# Patient Record
Sex: Female | Born: 1990
Health system: Southern US, Community
[De-identification: ages and names within clinical notes are randomized; demographics above are authoritative.]

## PROBLEM LIST (undated history)

## (undated) ENCOUNTER — Inpatient Hospital Stay (HOSPITAL_COMMUNITY): Payer: Self-pay

## (undated) DIAGNOSIS — F419 Anxiety disorder, unspecified: Secondary | ICD-10-CM

## (undated) DIAGNOSIS — R87629 Unspecified abnormal cytological findings in specimens from vagina: Secondary | ICD-10-CM

## (undated) DIAGNOSIS — Z8619 Personal history of other infectious and parasitic diseases: Secondary | ICD-10-CM

## (undated) DIAGNOSIS — B3731 Acute candidiasis of vulva and vagina: Secondary | ICD-10-CM

## (undated) DIAGNOSIS — Z789 Other specified health status: Secondary | ICD-10-CM

## (undated) DIAGNOSIS — T7840XA Allergy, unspecified, initial encounter: Secondary | ICD-10-CM

## (undated) DIAGNOSIS — B373 Candidiasis of vulva and vagina: Secondary | ICD-10-CM

## (undated) HISTORY — PX: WISDOM TOOTH EXTRACTION: SHX21

## (undated) HISTORY — DX: Personal history of other infectious and parasitic diseases: Z86.19

## (undated) HISTORY — DX: Allergy, unspecified, initial encounter: T78.40XA

## (undated) HISTORY — DX: Anxiety disorder, unspecified: F41.9

## (undated) HISTORY — DX: Unspecified abnormal cytological findings in specimens from vagina: R87.629

---

## 2012-06-07 LAB — OB RESULTS CONSOLE RPR: RPR: NONREACTIVE

## 2012-06-07 LAB — OB RESULTS CONSOLE ABO/RH: RH Type: POSITIVE

## 2012-06-07 LAB — OB RESULTS CONSOLE HIV ANTIBODY (ROUTINE TESTING): HIV: NONREACTIVE

## 2012-06-07 LAB — OB RESULTS CONSOLE GC/CHLAMYDIA: Chlamydia: NEGATIVE

## 2012-10-26 NOTE — L&D Delivery Note (Signed)
Delivery Note  SVD viable female Apgars 8,9 over intact perineum but right sulcus tear.  Placenta delivered spontaneously intact with 3VC. Repair with 2-0 Chromic with good support and hemostasis noted and R/V exam confirms.  PH art was sent.  [place] cord blood was not done.  Mother and baby were doing well.  EBL 300cc  Candice Camp, MD

## 2012-11-04 LAB — OB RESULTS CONSOLE GBS: GBS: NEGATIVE

## 2012-11-22 ENCOUNTER — Encounter (HOSPITAL_COMMUNITY): Payer: Self-pay

## 2012-11-22 ENCOUNTER — Inpatient Hospital Stay (HOSPITAL_COMMUNITY)
Admission: AD | Admit: 2012-11-22 | Discharge: 2012-11-22 | Disposition: A | Discharge status: Home / Self Care | Payer: 59 | Source: Ambulatory Visit | Attending: Obstetrics and Gynecology | Admitting: Obstetrics and Gynecology

## 2012-11-22 DIAGNOSIS — O2650 Maternal hypotension syndrome, unspecified trimester: Secondary | ICD-10-CM | POA: Insufficient documentation

## 2012-11-22 DIAGNOSIS — R55 Syncope and collapse: Secondary | ICD-10-CM

## 2012-11-22 DIAGNOSIS — O139 Gestational [pregnancy-induced] hypertension without significant proteinuria, unspecified trimester: Secondary | ICD-10-CM | POA: Insufficient documentation

## 2012-11-22 HISTORY — DX: Other specified health status: Z78.9

## 2012-11-22 LAB — URIC ACID: Uric Acid, Serum: 5.1 mg/dL (ref 2.4–7.0)

## 2012-11-22 LAB — URINALYSIS, ROUTINE W REFLEX MICROSCOPIC
Bilirubin Urine: NEGATIVE
Glucose, UA: NEGATIVE mg/dL
Hgb urine dipstick: NEGATIVE
Specific Gravity, Urine: 1.02 (ref 1.005–1.030)
Urobilinogen, UA: 0.2 mg/dL (ref 0.0–1.0)

## 2012-11-22 LAB — CBC
MCH: 31.5 pg (ref 26.0–34.0)
MCHC: 33.8 g/dL (ref 30.0–36.0)
MCV: 93.1 fL (ref 78.0–100.0)
Platelets: 227 10*3/uL (ref 150–400)
RDW: 12.9 % (ref 11.5–15.5)

## 2012-11-22 LAB — COMPREHENSIVE METABOLIC PANEL
Alkaline Phosphatase: 141 U/L — ABNORMAL HIGH (ref 39–117)
BUN: 10 mg/dL (ref 6–23)
Chloride: 102 mEq/L (ref 96–112)
GFR calc Af Amer: >90 mL/min (ref >90)
Glucose, Bld: 90 mg/dL (ref 70–99)
Sodium: 135 mEq/L (ref 135–145)
Total Bilirubin: 0.1 mg/dL — ABNORMAL LOW (ref 0.3–1.2)

## 2012-11-22 NOTE — MAU Note (Signed)
Pt states around 1400 had near syncopal episode at work, did not hit floor, felt weak and "funny", denies lof or bleeding.

## 2012-11-22 NOTE — MAU Note (Signed)
Pt states b/p was elevated immediately following near syncopal episode, b/p rechecked and was slightly lower.

## 2012-11-22 NOTE — MAU Provider Note (Signed)
Chief Complaint:  Hypertension  First Provider Initiated Contact with Patient 11/22/12 1629     HPI: Monica Garrett is a 22 y.o. G1P0 at [redacted]w[redacted]d who presents to maternity admissions reporting near-syncopal episode at 1430 at work. States she was sitting in a chair, "felt funny' and co-worker saw her start to slide down in the chair. They took her to a room to lie down and Sx resolved. CBG 100's at work immediately after. Has been mildly ill-feeling x a few days. Vomited twice in past 24 hour, few loose stools. Tolerating POs.  No fever, chills, URI Sx, HA, epigastric pain, SOB, chest pain, palpitations. Has seen spots a few times when she stands up, but not at rest. Seen at office after near-syncopal episode. BP 130's/80's per pt. Sent to MAU for PIH and near-syncope eval. Denies contractions, leakage of fluid or vaginal bleeding. Good fetal movement.   Past Medical History: Past Medical History  Diagnosis Date  . No pertinent past medical history   Neg for cardiac   Past obstetric history: OB History    Grav Para Term Preterm Abortions TAB SAB Ect Mult Living   1              # Outc Date GA Lbr Len/2nd Wgt Sex Del Anes PTL Lv   1 CUR               Past Surgical History: Past Surgical History  Procedure Date  . Wisdom tooth extraction     Family History: Family History  Problem Relation Age of Onset  . Other Neg Hx     Social History: History  Substance Use Topics  . Smoking status: Never Smoker   . Smokeless tobacco: Never Used  . Alcohol Use: No    Allergies: No Known Allergies  Meds:  No prescriptions prior to admission    ROS: Pertinent findings in history of present illness.  Physical Exam  Blood pressure 124/80, pulse 89, temperature 99 F (37.2 C), temperature source Oral, resp. rate 16, height 5\' 8"  (1.727 m), weight 197 lb (89.359 kg), SpO2 99.00%. BPs 120-130's/70-80's GENERAL: Well-developed, well-nourished female in no acute distress. Normal  color. HEENT: normocephalic HEART: normal rate RESP: normal effort ABDOMEN: Soft, non-tender, gravid appropriate for gestational age EXTREMITIES: Nontender, no edema NEURO: alert and oriented. DTRs 1+, 1 beat clonus LLE.  SPECULUM EXAM: deferred  FHT:  Baseline 140 , moderate variability, accelerations present, no decelerations Contractions: irreg, mild   Labs: Recent Results (from the past 168 hour(s))  CBC   Collection Time   11/22/12  3:59 PM      Component Value Range   WBC 9.2  4.0 - 10.5 K/uL   RBC 3.78 (*) 3.87 - 5.11 MIL/uL   Hemoglobin 11.9 (*) 12.0 - 15.0 g/dL   HCT 16.1 (*) 09.6 - 04.5 %   MCV 93.1  78.0 - 100.0 fL   MCH 31.5  26.0 - 34.0 pg   MCHC 33.8  30.0 - 36.0 g/dL   RDW 40.9  81.1 - 91.4 %   Platelets 227  150 - 400 K/uL  COMPREHENSIVE METABOLIC PANEL   Collection Time   11/22/12  3:59 PM      Component Value Range   Sodium 135  135 - 145 mEq/L   Potassium 3.8  3.5 - 5.1 mEq/L   Chloride 102  96 - 112 mEq/L   CO2 21  19 - 32 mEq/L   Glucose, Bld 90  70 -  99 mg/dL   BUN 10  6 - 23 mg/dL   Creatinine, Ser 1.61  0.50 - 1.10 mg/dL   Calcium 9.6  8.4 - 09.6 mg/dL   Total Protein 6.6  6.0 - 8.3 g/dL   Albumin 2.8 (*) 3.5 - 5.2 g/dL   AST 18  0 - 37 U/L   ALT 13  0 - 35 U/L   Alkaline Phosphatase 141 (*) 39 - 117 U/L   Total Bilirubin 0.1 (*) 0.3 - 1.2 mg/dL   GFR calc non Af Amer >90  >90 mL/min   GFR calc Af Amer >90  >90 mL/min  URIC ACID   Collection Time   11/22/12  3:59 PM      Component Value Range   Uric Acid, Serum 5.1  2.4 - 7.0 mg/dL  LACTATE DEHYDROGENASE   Collection Time   11/22/12  3:59 PM      Component Value Range   LDH 193  94 - 250 U/L  GLUCOSE, CAPILLARY   Collection Time   11/22/12  4:57 PM      Component Value Range   Glucose-Capillary 111 (*) 70 - 99 mg/dL  URINALYSIS, ROUTINE W REFLEX MICROSCOPIC   Collection Time   11/22/12  5:15 PM      Component Value Range   Color, Urine YELLOW  YELLOW   APPearance CLOUDY (*) CLEAR    Specific Gravity, Urine 1.020  1.005 - 1.030   pH 7.0  5.0 - 8.0   Glucose, UA NEGATIVE  NEGATIVE mg/dL   Hgb urine dipstick NEGATIVE  NEGATIVE   Bilirubin Urine NEGATIVE  NEGATIVE   Ketones, ur NEGATIVE  NEGATIVE mg/dL   Protein, ur NEGATIVE  NEGATIVE mg/dL   Urobilinogen, UA 0.2  0.0 - 1.0 mg/dL   Nitrite NEGATIVE  NEGATIVE   Leukocytes, UA NEGATIVE  NEGATIVE     Imaging:  NA  MAU Course: Tolerating juice and crackers. Feeling better. Ambulating w/out dizziness.  Assessment: 1. Near syncope possibly R/T dehydration due to poor intake/GI losses, resolved.  2. Transient hypertension of pregnancy, antepartum    Plan: Discharge home Labor precautions and fetal kick counts. Increased fluids, small frequent meals. Antiemetics PRN.  PIH precautions.      Follow-up Information    Follow up with Meriel Pica, MD. On 11/23/2012.   Contact information:   [address] [place] [address] [phone]       Follow up with [facility]. (As needed if symptoms worsen)    Contact information:   [address] [id number] mc [place] [address] [phone]          Medication List     As of 11/25/2012 12:46 PM    TAKE these medications         prenatal multivitamin Tabs   Take 1 tablet by mouth at bedtime.        Beecher, CNM 11/22/2012 4:22 PM

## 2012-12-01 ENCOUNTER — Encounter (HOSPITAL_COMMUNITY): Payer: Self-pay | Admitting: *Deleted

## 2012-12-01 ENCOUNTER — Telehealth (HOSPITAL_COMMUNITY): Payer: Self-pay | Admitting: *Deleted

## 2012-12-01 NOTE — Telephone Encounter (Signed)
Preadmission screen  

## 2012-12-07 ENCOUNTER — Encounter (HOSPITAL_COMMUNITY): Payer: Self-pay | Admitting: *Deleted

## 2012-12-07 ENCOUNTER — Inpatient Hospital Stay (HOSPITAL_COMMUNITY)
Admission: AD | Admit: 2012-12-07 | Discharge: 2012-12-10 | DRG: 775 | Disposition: A | Discharge status: Home / Self Care | Payer: 59 | Source: Ambulatory Visit | Attending: Obstetrics and Gynecology | Admitting: Obstetrics and Gynecology

## 2012-12-07 ENCOUNTER — Encounter (HOSPITAL_COMMUNITY): Payer: Self-pay | Admitting: Anesthesiology

## 2012-12-07 ENCOUNTER — Inpatient Hospital Stay (HOSPITAL_COMMUNITY): Payer: 59 | Admitting: Anesthesiology

## 2012-12-07 DIAGNOSIS — O4100X0 Oligohydramnios, unspecified trimester, not applicable or unspecified: Principal | ICD-10-CM | POA: Diagnosis present

## 2012-12-07 LAB — COMPREHENSIVE METABOLIC PANEL
ALT: 16 U/L (ref 0–35)
AST: 17 U/L (ref 0–37)
Albumin: 2.9 g/dL — ABNORMAL LOW (ref 3.5–5.2)
Alkaline Phosphatase: 159 U/L — ABNORMAL HIGH (ref 39–117)
Calcium: 9 mg/dL (ref 8.4–10.5)
GFR calc Af Amer: >90 mL/min (ref >90)
Glucose, Bld: 100 mg/dL — ABNORMAL HIGH (ref 70–99)
Potassium: 4.2 mEq/L (ref 3.5–5.1)
Sodium: 137 mEq/L (ref 135–145)
Total Protein: 6.8 g/dL (ref 6.0–8.3)

## 2012-12-07 LAB — CBC
MCHC: 33.4 g/dL (ref 30.0–36.0)
Platelets: 243 10*3/uL (ref 150–400)
RDW: 13 % (ref 11.5–15.5)
WBC: 11.5 10*3/uL — ABNORMAL HIGH (ref 4.0–10.5)

## 2012-12-07 MED ORDER — OXYTOCIN 40 UNITS IN LACTATED RINGERS INFUSION - SIMPLE MED
62.5000 mL/h | INTRAVENOUS | Status: DC
  Administered 2012-12-08: 62.5 mL/h via INTRAVENOUS

## 2012-12-07 MED ORDER — LACTATED RINGERS IV SOLN: 500.0000 mL | INTRAVENOUS | Status: DC | PRN

## 2012-12-07 MED ORDER — FLEET ENEMA 7-19 GM/118ML RE ENEM: 1.0000 | ENEMA | RECTAL | Status: DC | PRN

## 2012-12-07 MED ORDER — DIPHENHYDRAMINE HCL 50 MG/ML IJ SOLN: 12.5000 mg | INTRAMUSCULAR | Status: DC | PRN

## 2012-12-07 MED ORDER — LACTATED RINGERS IV SOLN
500.0000 mL | Freq: Once | INTRAVENOUS | Status: AC
  Administered 2012-12-07: 500 mL via INTRAVENOUS

## 2012-12-07 MED ORDER — LIDOCAINE HCL (PF) 1 % IJ SOLN
30.0000 mL | INTRAMUSCULAR | Status: DC | PRN
  Filled 2012-12-07: qty 30

## 2012-12-07 MED ORDER — EPHEDRINE 5 MG/ML INJ
10.0000 mg | INTRAVENOUS | Status: DC | PRN
  Filled 2012-12-07: qty 4

## 2012-12-07 MED ORDER — OXYTOCIN BOLUS FROM INFUSION
500.0000 mL | INTRAVENOUS | Status: DC
  Administered 2012-12-08: 500 mL via INTRAVENOUS

## 2012-12-07 MED ORDER — PHENYLEPHRINE 40 MCG/ML (10ML) SYRINGE FOR IV PUSH (FOR BLOOD PRESSURE SUPPORT)
80.0000 ug | PREFILLED_SYRINGE | INTRAVENOUS | Status: DC | PRN
  Filled 2012-12-07: qty 5

## 2012-12-07 MED ORDER — OXYCODONE-ACETAMINOPHEN 5-325 MG PO TABS: 1.0000 | ORAL_TABLET | ORAL | Status: DC | PRN

## 2012-12-07 MED ORDER — FENTANYL 2.5 MCG/ML BUPIVACAINE 1/10 % EPIDURAL INFUSION (WH - ANES)
14.0000 mL/h | INTRAMUSCULAR | Status: DC
  Administered 2012-12-07 – 2012-12-08 (×3): 14 mL/h via EPIDURAL
  Filled 2012-12-07 (×3): qty 125

## 2012-12-07 MED ORDER — EPHEDRINE 5 MG/ML INJ: 10.0000 mg | INTRAVENOUS | Status: DC | PRN

## 2012-12-07 MED ORDER — SODIUM BICARBONATE 8.4 % IV SOLN
INTRAVENOUS | Status: DC | PRN
  Administered 2012-12-07: 5 mL via EPIDURAL

## 2012-12-07 MED ORDER — ONDANSETRON HCL 4 MG/2ML IJ SOLN
4.0000 mg | Freq: Four times a day (QID) | INTRAMUSCULAR | Status: DC | PRN
  Administered 2012-12-07: 4 mg via INTRAVENOUS
  Filled 2012-12-07: qty 2

## 2012-12-07 MED ORDER — PRENATAL MULTIVITAMIN CH: 1.0000 | ORAL_TABLET | Freq: Every day | ORAL | Status: DC

## 2012-12-07 MED ORDER — ACETAMINOPHEN 325 MG PO TABS: 650.0000 mg | ORAL_TABLET | ORAL | Status: DC | PRN

## 2012-12-07 MED ORDER — OXYTOCIN 40 UNITS IN LACTATED RINGERS INFUSION - SIMPLE MED
1.0000 m[IU]/min | INTRAVENOUS | Status: DC
  Administered 2012-12-07: 2 m[IU]/min via INTRAVENOUS
  Filled 2012-12-07: qty 1000

## 2012-12-07 MED ORDER — TERBUTALINE SULFATE 1 MG/ML IJ SOLN: 0.2500 mg | Freq: Once | INTRAMUSCULAR | Status: AC | PRN

## 2012-12-07 MED ORDER — PHENYLEPHRINE 40 MCG/ML (10ML) SYRINGE FOR IV PUSH (FOR BLOOD PRESSURE SUPPORT): 80.0000 ug | PREFILLED_SYRINGE | INTRAVENOUS | Status: DC | PRN

## 2012-12-07 MED ORDER — LACTATED RINGERS IV SOLN
INTRAVENOUS | Status: DC
  Administered 2012-12-07 (×2): via INTRAVENOUS

## 2012-12-07 MED ORDER — CITRIC ACID-SODIUM CITRATE 334-500 MG/5ML PO SOLN: 30.0000 mL | ORAL | Status: DC | PRN

## 2012-12-07 MED ORDER — IBUPROFEN 600 MG PO TABS: 600.0000 mg | ORAL_TABLET | Freq: Four times a day (QID) | ORAL | Status: DC | PRN

## 2012-12-07 NOTE — Progress Notes (Signed)
Dr Rana Snare called for update on pt, informed of ptstatus, sve, fhr, uc pattern, decreasing pitocin, no new orders reiceved

## 2012-12-07 NOTE — Anesthesia Preprocedure Evaluation (Signed)
Anesthesia Evaluation  Patient identified by MRN, date of birth, ID band Patient awake    Reviewed: Allergy & Precautions, H&P , Patient's Chart, lab work & pertinent test results  History of Anesthesia Complications (+) PONV  Airway Mallampati: II TM Distance: >3 FB Neck ROM: full    Dental  (+) Teeth Intact   Pulmonary  breath sounds clear to auscultation        Cardiovascular Rhythm:regular Rate:Normal     Neuro/Psych    GI/Hepatic   Endo/Other    Renal/GU      Musculoskeletal   Abdominal   Peds  Hematology   Anesthesia Other Findings       Reproductive/Obstetrics (+) Pregnancy                           Anesthesia Physical Anesthesia Plan  ASA: II  Anesthesia Plan: Epidural   Post-op Pain Management:    Induction:   Airway Management Planned:   Additional Equipment:   Intra-op Plan:   Post-operative Plan:   Informed Consent: I have reviewed the patients History and Physical, chart, labs and discussed the procedure including the risks, benefits and alternatives for the proposed anesthesia with the patient or authorized representative who has indicated his/her understanding and acceptance.   Dental Advisory Given  Plan Discussed with:   Anesthesia Plan Comments: (Labs checked- platelets confirmed with RN in room. Fetal heart tracing, per RN, reported to be stable enough for sitting procedure. Discussed epidural, and patient consents to the procedure:  included risk of possible headache,backache, failed block, allergic reaction, and nerve injury. This patient was asked if she had any questions or concerns before the procedure started. )        Anesthesia Quick Evaluation

## 2012-12-07 NOTE — H&P (Signed)
Monica Garrett is a 22 y.o. female presenting for Induction of labor due to oligohydraminos. Pt seen in the office this morning by Dr Vincente Poli after [place].  EFW was 6+15 lb which is 27%, her AFI was less than 3rd% and suspicious for meconium.  Pt has had labile BPs in the last several visits but today was normal.  Pts cervix is favorable and pt was sent to L&D for induction.  GBS-. History OB History   Grav Para Term Preterm Abortions TAB SAB Ect Mult Living   1              Past Medical History  Diagnosis Date  . No pertinent past medical history    Past Surgical History  Procedure Laterality Date  . Wisdom tooth extraction     Family History: family history includes Cancer in her maternal grandmother and paternal grandfather and Hypertension in her father and mother.  There is no history of Other. Social History:  reports that she has never smoked. She has never used smokeless tobacco. She reports that she does not drink alcohol or use illicit drugs.   Prenatal Transfer Tool  Maternal Diabetes: No Genetic Screening: Normal Maternal Ultrasounds/Referrals: Normal Fetal Ultrasounds or other Referrals:  None recent with oligohydraminos Maternal Substance Abuse:  No Significant Maternal Medications:  None Significant Maternal Lab Results:  None Other Comments:  None  ROS  Dilation: 2 Effacement (%): 40 Station: -2 Exam by:: Isac Sarna, RN Blood pressure 128/84, pulse 84, temperature 98.7 F (37.1 C), temperature source Oral, resp. rate 16, height 5\' 8"  (1.727 m), weight 89.359 kg (197 lb). Exam Physical Exam  Prenatal labs: ABO, Rh: --/Positive/-- (08/13 0000) Antibody: Negative (08/13 0000) Rubella: Immune (08/13 0000) RPR: Nonreactive (08/13 0000)  HBsAg: Negative (08/13 0000)  HIV: Non-reactive (08/13 0000)  GBS: Negative (01/10 0000)   Assessment/Plan: IUP at term with oligohydraminos and favorable cervix.  Plan Pitocin and AROM  Anticipate SVD   Addasyn Mcbreen  C 12/07/2012, 11:49 AM

## 2012-12-07 NOTE — Anesthesia Procedure Notes (Signed)

## 2012-12-08 ENCOUNTER — Encounter (HOSPITAL_COMMUNITY): Payer: Self-pay | Admitting: *Deleted

## 2012-12-08 LAB — RPR: RPR Ser Ql: NONREACTIVE

## 2012-12-08 MED ORDER — SENNOSIDES-DOCUSATE SODIUM 8.6-50 MG PO TABS
2.0000 | ORAL_TABLET | Freq: Every day | ORAL | Status: DC
  Administered 2012-12-08 – 2012-12-09 (×2): 2 via ORAL

## 2012-12-08 MED ORDER — DIPHENHYDRAMINE HCL 25 MG PO CAPS: 25.0000 mg | ORAL_CAPSULE | Freq: Four times a day (QID) | ORAL | Status: DC | PRN

## 2012-12-08 MED ORDER — ONDANSETRON HCL 4 MG/2ML IJ SOLN: 4.0000 mg | INTRAMUSCULAR | Status: DC | PRN

## 2012-12-08 MED ORDER — ONDANSETRON HCL 4 MG PO TABS: 4.0000 mg | ORAL_TABLET | ORAL | Status: DC | PRN

## 2012-12-08 MED ORDER — PRENATAL MULTIVITAMIN CH
1.0000 | ORAL_TABLET | Freq: Every day | ORAL | Status: DC
  Administered 2012-12-08 – 2012-12-10 (×3): 1 via ORAL
  Filled 2012-12-08 (×3): qty 1

## 2012-12-08 MED ORDER — LANOLIN HYDROUS EX OINT: TOPICAL_OINTMENT | CUTANEOUS | Status: DC | PRN

## 2012-12-08 MED ORDER — BENZOCAINE-MENTHOL 20-0.5 % EX AERO
1.0000 "application " | INHALATION_SPRAY | CUTANEOUS | Status: DC | PRN
  Administered 2012-12-10: 1 via TOPICAL
  Filled 2012-12-08 (×2): qty 56

## 2012-12-08 MED ORDER — WITCH HAZEL-GLYCERIN EX PADS
1.0000 "application " | MEDICATED_PAD | CUTANEOUS | Status: DC | PRN
  Administered 2012-12-10: 1 via TOPICAL

## 2012-12-08 MED ORDER — DIBUCAINE 1 % RE OINT
1.0000 "application " | TOPICAL_OINTMENT | RECTAL | Status: DC | PRN
  Filled 2012-12-08: qty 28

## 2012-12-08 MED ORDER — ZOLPIDEM TARTRATE 5 MG PO TABS: 5.0000 mg | ORAL_TABLET | Freq: Every evening | ORAL | Status: DC | PRN

## 2012-12-08 MED ORDER — SIMETHICONE 80 MG PO CHEW: 80.0000 mg | CHEWABLE_TABLET | ORAL | Status: DC | PRN

## 2012-12-08 MED ORDER — MEASLES, MUMPS & RUBELLA VAC ~~LOC~~ INJ
0.5000 mL | INJECTION | Freq: Once | SUBCUTANEOUS | Status: DC
  Filled 2012-12-08: qty 0.5

## 2012-12-08 MED ORDER — OXYCODONE-ACETAMINOPHEN 5-325 MG PO TABS
1.0000 | ORAL_TABLET | ORAL | Status: DC | PRN
  Administered 2012-12-08 (×3): 1 via ORAL
  Administered 2012-12-09 (×4): 2 via ORAL
  Administered 2012-12-10: 1 via ORAL
  Filled 2012-12-08: qty 1
  Filled 2012-12-08 (×2): qty 2
  Filled 2012-12-08 (×2): qty 1
  Filled 2012-12-08: qty 2
  Filled 2012-12-08: qty 1

## 2012-12-08 MED ORDER — MEDROXYPROGESTERONE ACETATE 150 MG/ML IM SUSP: 150.0000 mg | INTRAMUSCULAR | Status: DC | PRN

## 2012-12-08 MED ORDER — IBUPROFEN 600 MG PO TABS
600.0000 mg | ORAL_TABLET | Freq: Four times a day (QID) | ORAL | Status: DC
  Administered 2012-12-08 – 2012-12-10 (×10): 600 mg via ORAL
  Filled 2012-12-08 (×10): qty 1

## 2012-12-08 MED ORDER — TETANUS-DIPHTH-ACELL PERTUSSIS 5-2.5-18.5 LF-MCG/0.5 IM SUSP
0.5000 mL | Freq: Once | INTRAMUSCULAR | Status: AC
  Administered 2012-12-09: 0.5 mL via INTRAMUSCULAR
  Filled 2012-12-08: qty 0.5

## 2012-12-08 NOTE — Anesthesia Postprocedure Evaluation (Signed)
  Anesthesia Post-op Note  Patient: Monica Garrett  Procedure(s) Performed: * No procedures listed *  Patient Location: PACU and Mother/Baby  Anesthesia Type:Epidural  Level of Consciousness: awake, alert  and oriented  Airway and Oxygen Therapy: Patient Spontanous Breathing  Post-op Pain: mild  Post-op Assessment: Post-op Vital signs reviewed and Patient's Cardiovascular Status Stable  Post-op Vital Signs: Reviewed and stable  Complications: No apparent anesthesia complications

## 2012-12-08 NOTE — Progress Notes (Signed)
Post Partum Day 1 Subjective: no complaints and up ad lib  Objective: Blood pressure 114/71, pulse 75, temperature 98.6 F (37 C), temperature source Oral, resp. rate 20, height 5\' 8"  (1.727 m), weight 197 lb (89.359 kg), SpO2 97.00%, unknown if currently breastfeeding.  Physical Exam:  General: alert, cooperative and appears stated age Lochia: appropriate Uterine Fundus: firm Incision: healing well DVT Evaluation: No evidence of DVT seen on physical exam.   Recent Labs  12/07/12 1035  HGB 13.2  HCT 39.5    Assessment/Plan: Circumcision prior to discharge Routine care   LOS: 1 day   Monica Garrett L 12/08/2012, 8:04 AM

## 2012-12-09 LAB — CBC
HCT: 30 % — ABNORMAL LOW (ref 36.0–46.0)
Hemoglobin: 10 g/dL — ABNORMAL LOW (ref 12.0–15.0)
MCH: 31.3 pg (ref 26.0–34.0)
MCV: 93.8 fL (ref 78.0–100.0)
RBC: 3.2 MIL/uL — ABNORMAL LOW (ref 3.87–5.11)

## 2012-12-09 MED ORDER — BENZOCAINE-MENTHOL 20-0.5 % EX AERO: 1.0000 "application " | INHALATION_SPRAY | CUTANEOUS | Status: DC | PRN

## 2012-12-09 MED ORDER — IBUPROFEN 600 MG PO TABS: 600.0000 mg | ORAL_TABLET | Freq: Four times a day (QID) | ORAL | Status: DC | PRN

## 2012-12-09 MED ORDER — OXYCODONE-ACETAMINOPHEN 5-325 MG PO TABS: 1.0000 | ORAL_TABLET | Freq: Four times a day (QID) | ORAL | Status: DC | PRN

## 2012-12-09 NOTE — Progress Notes (Signed)
Post Partum Day 2 Subjective: no complaints  Objective: Blood pressure 97/61, pulse 72, temperature 98.1 F (36.7 C), temperature source Oral, resp. rate 20, height 5\' 8"  (1.727 m), weight 197 lb (89.359 kg), SpO2 94.00%, unknown if currently breastfeeding.  Physical Exam:  General: alert, cooperative and no distress Lochia: appropriate Uterine Fundus: firm Incision: healing well DVT Evaluation: No evidence of DVT seen on physical exam.   Recent Labs  12/07/12 1035 12/09/12 0616  HGB 13.2 10.0*  HCT 39.5 30.0*    Assessment/Plan: Discharge home   LOS: 2 days   Ladell Lea II,Ellice Boultinghouse E 12/09/2012, 9:03 AM

## 2012-12-09 NOTE — Discharge Summary (Signed)
Obstetric Discharge Summary Reason for Admission: induction of labor Prenatal Procedures: ultrasound Intrapartum Procedures: spontaneous vaginal delivery Postpartum Procedures: none Complications-Operative and Postpartum: none Hemoglobin  Date Value Range Status  12/09/2012 10.0* 12.0 - 15.0 g/dL Final     DELTA CHECK NOTED     REPEATED TO VERIFY     HCT  Date Value Range Status  12/09/2012 30.0* 36.0 - 46.0 % Final    Physical Exam:  General: alert, cooperative and no distress Lochia: appropriate Uterine Fundus: firm Incision: healing well DVT Evaluation: No evidence of DVT seen on physical exam.  Discharge Diagnoses: Term Pregnancy-delivered  Discharge Information: Date: 12/09/2012 Activity: pelvic rest Diet: routine Medications: PNV, Ibuprofen and Percocet Condition: stable Instructions: refer to practice specific booklet Discharge to: home   Newborn Data: Live born female  Birth Weight: 7 lb 6 oz (3345 g) APGAR: 8, 9  Home with mother.  Monicka Cyran II,Myrikal Messmer E 12/09/2012, 9:07 AM

## 2012-12-10 MED ORDER — OXYCODONE-ACETAMINOPHEN 5-325 MG PO TABS: 2.0000 | ORAL_TABLET | Freq: Four times a day (QID) | ORAL | Status: DC | PRN

## 2012-12-10 NOTE — Progress Notes (Signed)
PPD #2 Decided to not go home yesterday Is ready today No C/O  VSS Afeb FFNT  D/C home

## 2013-05-24 ENCOUNTER — Telehealth: Payer: Self-pay

## 2013-05-24 ENCOUNTER — Ambulatory Visit (INDEPENDENT_AMBULATORY_CARE_PROVIDER_SITE_OTHER): Payer: BC Managed Care – PPO | Admitting: Family Medicine

## 2013-05-24 VITALS — BP 122/68 | HR 76 | Temp 98.0°F | Resp 18 | Ht 69.0 in | Wt 174.0 lb

## 2013-05-24 DIAGNOSIS — L089 Local infection of the skin and subcutaneous tissue, unspecified: Secondary | ICD-10-CM

## 2013-05-24 DIAGNOSIS — L309 Dermatitis, unspecified: Secondary | ICD-10-CM

## 2013-05-24 DIAGNOSIS — L259 Unspecified contact dermatitis, unspecified cause: Secondary | ICD-10-CM

## 2013-05-24 MED ORDER — TRIAMCINOLONE ACETONIDE 0.1 % EX CREA: TOPICAL_CREAM | Freq: Three times a day (TID) | CUTANEOUS | Status: DC

## 2013-05-24 MED ORDER — DOXYCYCLINE HYCLATE 100 MG PO TABS: 100.0000 mg | ORAL_TABLET | Freq: Two times a day (BID) | ORAL | Status: DC

## 2013-05-24 NOTE — Telephone Encounter (Signed)
Patient was seen today by Dr. Conley Rolls and was prescribed Doxycyline. She was unable to pick up because they were having computer issues at pharmacy. [facility] was unable to process electronically. They asked patient to have Dr. Conley Rolls call it in or another physician.   Patient uses [facility] on [address].   Pt's Phone number: [phone]

## 2013-05-24 NOTE — Patient Instructions (Signed)

## 2013-05-24 NOTE — Progress Notes (Signed)
 [facility]:  Office Visit  Chief Complaint:  Chief Complaint  Patient presents with  . Follow-up    was giving bactrim and states she is no better, bump on right hand     HPI: Monica Garrett is a 22 y.o. female who complains of > 3 week history of rash on hands, she has been on amoxacillin and also 2 rounds of bactrim. She also was given steroid with bactrim. When she was done with prednisone it hase rturned. 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 Skin/wound cx was + MRSA . She is not allergic to latex, no new soaps, no new meds except abx. She has a 9 month old baby but is not breastfeeding.  Does get SOB sometimes at rest and going up stairs but no risk factors for DVT except birth control.   Past Medical History  Diagnosis Date  . No pertinent past medical history   . Allergy   . Anxiety    Past Surgical History  Procedure Laterality Date  . Wisdom tooth extraction     History   Social History  . Marital Status: Married    Spouse Name: N/A    Number of Children: N/A  . Years of Education: N/A   Social History Main Topics  . Smoking status: Never Smoker   . Smokeless tobacco: Never Used  . Alcohol Use: No  . Drug Use: No  . Sexually Active: Yes    Birth Control/ Protection: None   Other Topics Concern  . None   Social History Narrative  . None   Family History  Problem Relation Age of Onset  . Other Neg Hx   . Hypertension Mother   . Hypertension Father   . Cancer Maternal Grandmother     ovarian  . Cancer Paternal Grandfather     pancreatic   No Known Allergies Prior to Admission medications   Medication Sig Start Date End Date Taking? Authorizing Provider  Norgestimate-Eth Estradiol (ORTHO-CYCLEN, 28, PO) Take by mouth.   Yes Historical Provider, MD  predniSONE (DELTASONE) 10 MG tablet Take 10 mg by mouth daily.   Yes Historical Provider, MD  sulfamethoxazole-trimethoprim (BACTRIM DS) 800-160 MG per tablet Take 1  tablet by mouth 2 (two) times daily.   Yes Historical Provider, MD  benzocaine-Menthol (DERMOPLAST) 20-0.5 % AERO Apply 1 application topically as needed (perineal discomfort). 12/09/12   Roselle Locus II, MD  calcium carbonate (TUMS - DOSED IN MG ELEMENTAL CALCIUM) 500 MG chewable tablet Chew 1 tablet by mouth daily as needed for heartburn.    Historical Provider, MD  ibuprofen (ADVIL,MOTRIN) 600 MG tablet Take 1 tablet (600 mg total) by mouth every 6 (six) hours as needed for pain. 12/09/12   Roselle Locus II, MD  oxyCODONE-acetaminophen (PERCOCET/ROXICET) 5-325 MG per tablet Take 1-2 tablets by mouth every 6 (six) hours as needed. 12/09/12   Roselle Locus II, MD  oxyCODONE-acetaminophen (PERCOCET/ROXICET) 5-325 MG per tablet Take 2 tablets by mouth every 6 (six) hours as needed for pain. 12/10/12   Roselle Locus II, MD  Prenatal Vit-Fe Fumarate-FA (PRENATAL MULTIVITAMIN) TABS Take 1 tablet by mouth at bedtime.    Historical Provider, MD     ROS: The patient denies fevers, chills, night sweats, unintentional weight loss, chest pain, palpitations, wheezing,  nausea, vomiting, abdominal pain, dysuria, hematuria, melena, numbness, weakness, or tingling.   All other systems have been reviewed and were otherwise negative with  the exception of those mentioned in the HPI and as above.    PHYSICAL EXAM: Filed Vitals:   05/24/13 0926  BP: 122/68  Pulse: 76  Temp: 98 F (36.7 C)  Resp: 18   Filed Vitals:   05/24/13 0926  Height: 5\' 9"  (1.753 m)  Weight: 174 lb (78.926 kg)   Body mass index is 25.68 kg/(m^2).  General: Alert, no acute distress HEENT:  Normocephalic, atraumatic, oropharynx patent.  Cardiovascular:  Regular rate and rhythm, no rubs murmurs or gallops.  No Carotid bruits, radial pulse intact. No pedal edema.  Respiratory: Clear to auscultation bilaterally.  No wheezes, rales, or rhonchi.  No cyanosis, no use of accessory musculature GI: No organomegaly, abdomen is soft and  non-tender, positive bowel sounds.  No masses. Skin: + eczematous 3rd finger base excoriated, oozing, and left 4th ring finger + eczema and excoriated and peeling.  Neurologic: Facial musculature symmetric. Psychiatric: Patient is appropriate throughout our interaction. Lymphatic: No cervical lymphadenopathy Musculoskeletal: Gait intact. Neg Homans No asymm leg swelling   LABS: Results for orders placed during the hospital encounter of 12/07/12  CBC      Result Value Range   WBC 11.5 (*) 4.0 - 10.5 K/uL   RBC 4.22  3.87 - 5.11 MIL/uL   Hemoglobin 13.2  12.0 - 15.0 g/dL   HCT 78.2  95.6 - 21.3 %   MCV 93.6  78.0 - 100.0 fL   MCH 31.3  26.0 - 34.0 pg   MCHC 33.4  30.0 - 36.0 g/dL   RDW 08.6  57.8 - 46.9 %   Platelets 243  150 - 400 K/uL  RPR      Result Value Range   RPR NON REACTIVE  NON REACTIVE  COMPREHENSIVE METABOLIC PANEL      Result Value Range   Sodium 137  135 - 145 mEq/L   Potassium 4.2  3.5 - 5.1 mEq/L   Chloride 102  96 - 112 mEq/L   CO2 21  19 - 32 mEq/L   Glucose, Bld 100 (*) 70 - 99 mg/dL   BUN 10  6 - 23 mg/dL   Creatinine, Ser 6.29  0.50 - 1.10 mg/dL   Calcium 9.0  8.4 - 52.8 mg/dL   Total Protein 6.8  6.0 - 8.3 g/dL   Albumin 2.9 (*) 3.5 - 5.2 g/dL   AST 17  0 - 37 U/L   ALT 16  0 - 35 U/L   Alkaline Phosphatase 159 (*) 39 - 117 U/L   Total Bilirubin 0.2 (*) 0.3 - 1.2 mg/dL   GFR calc non Af Amer >90  >90 mL/min   GFR calc Af Amer >90  >90 mL/min  LACTATE DEHYDROGENASE      Result Value Range   LDH 167  94 - 250 U/L  URIC ACID      Result Value Range   Uric Acid, Serum 5.7  2.4 - 7.0 mg/dL  CBC      Result Value Range   WBC 15.1 (*) 4.0 - 10.5 K/uL   RBC 3.20 (*) 3.87 - 5.11 MIL/uL   Hemoglobin 10.0 (*) 12.0 - 15.0 g/dL   HCT 41.3 (*) 24.4 - 01.0 %   MCV 93.8  78.0 - 100.0 fL   MCH 31.3  26.0 - 34.0 pg   MCHC 33.3  30.0 - 36.0 g/dL   RDW 27.2  53.6 - 64.4 %   Platelets 201  150 - 400 K/uL  OB RESULTS  CONSO ABO/RH      Result Value Range    ABO Grouping B       EKG/XRAY:   Primary read interpreted by Dr. Conley Rolls at [facility].   ASSESSMENT/PLAN: Encounter Diagnoses  Name Primary?  . Eczema of hand Yes  . Skin infection    Rx Triamicinonlone TID Rx Doxycycline if no improvement with Triamcinolone DC Batrim F/u prn Refer to dermatology    ,  PHUONG, DO 05/24/2013 9:47 AM

## 2013-05-24 NOTE — Telephone Encounter (Signed)
The phone line is not working either, I have tried calling x3 and have gotten busy signal, called her to ask if I can send to another facility, have done this, sent to [facility].

## 2014-05-30 ENCOUNTER — Other Ambulatory Visit: Payer: Self-pay | Admitting: Internal Medicine

## 2014-05-30 DIAGNOSIS — E010 Iodine-deficiency related diffuse (endemic) goiter: Secondary | ICD-10-CM

## 2014-05-31 ENCOUNTER — Ambulatory Visit
Admission: RE | Admit: 2014-05-31 | Discharge: 2014-05-31 | Disposition: A | Discharge status: Home / Self Care | Payer: BC Managed Care – PPO | Source: Ambulatory Visit | Attending: Internal Medicine | Admitting: Internal Medicine

## 2014-05-31 DIAGNOSIS — E010 Iodine-deficiency related diffuse (endemic) goiter: Secondary | ICD-10-CM

## 2014-06-01 ENCOUNTER — Other Ambulatory Visit: Payer: Self-pay | Admitting: Internal Medicine

## 2014-06-01 DIAGNOSIS — E049 Nontoxic goiter, unspecified: Secondary | ICD-10-CM

## 2014-12-26 ENCOUNTER — Other Ambulatory Visit: Payer: Self-pay | Admitting: Obstetrics & Gynecology

## 2014-12-26 ENCOUNTER — Other Ambulatory Visit (HOSPITAL_COMMUNITY)
Admission: RE | Admit: 2014-12-26 | Discharge: 2014-12-26 | Disposition: A | Discharge status: Home / Self Care | Payer: BLUE CROSS/BLUE SHIELD | Source: Ambulatory Visit | Attending: Obstetrics and Gynecology | Admitting: Obstetrics and Gynecology

## 2014-12-26 DIAGNOSIS — Z01419 Encounter for gynecological examination (general) (routine) without abnormal findings: Secondary | ICD-10-CM | POA: Diagnosis not present

## 2014-12-27 LAB — CYTOLOGY - PAP

## 2015-03-13 ENCOUNTER — Telehealth: Payer: Self-pay

## 2015-03-13 NOTE — Telephone Encounter (Signed)
Patient call reporting dark brown bleeding and cramping.  Patient reports changing two liners throughout the day.  Patient also reports some nausea, but has not taken anything.  Patient states that she has drank about 6-8 cups of water and has taken ibuprofen this morning around 8am.  Patient states that the cramping started last night and has been ongoing throughout the day.  Describes as "tightening on the right side and cramping in the abdominal area."  [facility]  Patient educated on old blood vs active bleeding. Patient  instructed to continue to hydrate and take tylenol for cramping.  Patient admits that she worked 12 hours and informed that 6-8 cups may not be adequate when on your feet for extended periods of time.  Patient verbalized understanding and verified all instructions given.  Encouraged to call back or report to MAU if cramping does not ease up or bleeding increases or changes in color.

## 2015-03-14 ENCOUNTER — Inpatient Hospital Stay (HOSPITAL_COMMUNITY): Payer: Medicaid Other

## 2015-03-14 ENCOUNTER — Encounter (HOSPITAL_COMMUNITY): Payer: Self-pay | Admitting: *Deleted

## 2015-03-14 ENCOUNTER — Inpatient Hospital Stay (HOSPITAL_COMMUNITY)
Admission: AD | Admit: 2015-03-14 | Discharge: 2015-03-14 | Disposition: A | Discharge status: Home / Self Care | Payer: Medicaid Other | Source: Ambulatory Visit | Attending: Obstetrics & Gynecology | Admitting: Obstetrics & Gynecology

## 2015-03-14 DIAGNOSIS — O209 Hemorrhage in early pregnancy, unspecified: Secondary | ICD-10-CM

## 2015-03-14 DIAGNOSIS — Z3A08 8 weeks gestation of pregnancy: Secondary | ICD-10-CM

## 2015-03-14 DIAGNOSIS — R109 Unspecified abdominal pain: Secondary | ICD-10-CM | POA: Insufficient documentation

## 2015-03-14 DIAGNOSIS — O208 Other hemorrhage in early pregnancy: Secondary | ICD-10-CM | POA: Insufficient documentation

## 2015-03-14 LAB — URINALYSIS, ROUTINE W REFLEX MICROSCOPIC
BILIRUBIN URINE: NEGATIVE
Glucose, UA: NEGATIVE mg/dL
Ketones, ur: NEGATIVE mg/dL
Leukocytes, UA: NEGATIVE
NITRITE: NEGATIVE
PROTEIN: NEGATIVE mg/dL
Specific Gravity, Urine: 1.01 (ref 1.005–1.030)
UROBILINOGEN UA: 0.2 mg/dL (ref 0.0–1.0)
pH: 7 (ref 5.0–8.0)

## 2015-03-14 LAB — POCT PREGNANCY, URINE: PREG TEST UR: POSITIVE — AB

## 2015-03-14 LAB — URINE MICROSCOPIC-ADD ON

## 2015-03-14 NOTE — MAU Note (Signed)
Was seen in Dr. Charlotta Newtonzan office last Monday for vaginal bleeding after positive urine and blood pregnancy test.  Pt states on ultrasound show subchorionic Hem.  Pt states she has been having brownish discharge since then.  Today she is experiencing a lot of abd cramping and said she had some bright red bleeding when voiding here in MAU.

## 2015-03-14 NOTE — MAU Provider Note (Signed)
History     CSN: [id number]  Arrival date and time: 03/14/15 1547   First Provider Initiated Contact with Patient 03/14/15 1618      Chief Complaint  Patient presents with  . Abdominal Pain   Abdominal Pain This is a recurrent problem. The current episode started today. The onset quality is sudden. The problem occurs intermittently. The problem has been waxing and waning. The pain is at a severity of 3/10. The pain is mild. The quality of the pain is cramping. The abdominal pain does not radiate. Pertinent negatives include no dysuria, fever, nausea or vomiting. Nothing aggravates the pain. The pain is relieved by nothing. She has tried nothing for the symptoms.   24 y.o. G2P1001 [id number] presents to the MAU with known diagnosed subchorionic hemorrhage. She reports brownish discharge since then. Today she is experiencing a lot of abd cramping and said she had some bright red bleeding when voiding here in MAU. brownish discharge since then. Today she is experiencing a lot of abd cramping and said she had some bright red bleeding when voiding here in MAU.    Past Medical History  Diagnosis Date  . No pertinent past medical history   . Allergy   . Anxiety     Past Surgical History  Procedure Laterality Date  . Wisdom tooth extraction      Family History  Problem Relation Age of Onset  . Other Neg Hx   . Hypertension Mother   . Hypertension Father   . Cancer Maternal Grandmother     ovarian  . Cancer Paternal Grandfather     pancreatic    History  Substance Use Topics  . Smoking status: Never Smoker   . Smokeless tobacco: Never Used  . Alcohol Use: No    Allergies: No Known Allergies  Prescriptions prior to admission  Medication Sig Dispense Refill Last Dose  . Prenatal Vit-Fe Fumarate-FA (PRENATAL MULTIVITAMIN) TABS tablet Take 1 tablet by mouth daily at 12 noon.   03/14/2015 at Unknown time  . doxycycline (VIBRA-TABS) 100 MG tablet Take 1 tablet (100 mg total) by  mouth 2 (two) times daily. (Patient not taking: Reported on 03/14/2015) 20 tablet 0   . oxyCODONE-acetaminophen (PERCOCET/ROXICET) 5-325 MG per tablet Take 1-2 tablets by mouth every 6 (six) hours as needed. (Patient not taking: Reported on 03/14/2015) 30 tablet 0 Not Taking at Unknown time  . triamcinolone cream (KENALOG) 0.1 % Apply topically 3 (three) times daily. (Patient not taking: Reported on 03/14/2015) 60 g 0     Review of Systems  Constitutional: Negative for fever.  Gastrointestinal: Positive for abdominal pain. Negative for nausea and vomiting.  Genitourinary: Negative for dysuria.       Vaginal bleeding  All other systems reviewed and are negative.  Physical Exam   Blood pressure 127/76, pulse 73, temperature 98 F (36.7 C), temperature source Oral, resp. rate 18, height 5\' 7"  (1.702 m), weight 80.65 kg (177 lb 12.8 oz), last menstrual period 01/13/2015, SpO2 100 %.  Physical Exam  Nursing note and vitals reviewed. Constitutional: She is oriented to person, place, and time. She appears well-developed and well-nourished. No distress.  HENT:  Head: Normocephalic and atraumatic.  Neck: Normal range of motion. No thyromegaly present.  Cardiovascular: Normal rate.   Respiratory: Effort normal. No respiratory distress.  GI: Soft. She exhibits no distension and no mass. There is tenderness. There is no rebound and no guarding.  Musculoskeletal: Normal range of motion. She exhibits no edema.  Neurological: She is alert and oriented to person, place, and time.  Skin: Skin is warm and dry. No rash noted. No erythema. No pallor.  Psychiatric: She has a normal mood and affect. Her behavior is normal. Judgment and thought content normal.   Results for orders placed or performed during the hospital encounter of 03/14/15 (from the past 24 hour(s))  Urinalysis, Routine w reflex microscopic     Status: Abnormal   Collection Time: 03/14/15  3:55 PM  Result Value Ref Range   Color, Urine  YELLOW YELLOW   APPearance CLEAR CLEAR   Specific Gravity, Urine 1.010 1.005 - 1.030   pH 7.0 5.0 - 8.0   Glucose, UA NEGATIVE NEGATIVE mg/dL   Hgb urine dipstick LARGE (A) NEGATIVE   Bilirubin Urine NEGATIVE NEGATIVE   Ketones, ur NEGATIVE NEGATIVE mg/dL   Protein, ur NEGATIVE NEGATIVE mg/dL   Urobilinogen, UA 0.2 0.0 - 1.0 mg/dL   Nitrite NEGATIVE NEGATIVE   Leukocytes, UA NEGATIVE NEGATIVE  Urine microscopic-add on     Status: Abnormal   Collection Time: 03/14/15  3:55 PM  Result Value Ref Range   Squamous Epithelial / LPF FEW (A) RARE   RBC / HPF 21-50 <3 RBC/hpf  Pregnancy, urine POC     Status: Abnormal   Collection Time: 03/14/15  4:10 PM  Result Value Ref Range   Preg Test, Ur POSITIVE (A) NEGATIVE  [place] Ob Comp Less 14 Wks  03/14/2015   CLINICAL DATA:  Acute onset of vaginal bleeding.  Initial encounter.  EXAM: OBSTETRIC <14 WK US AND TRANSVAGINAL OB US  TECHNIQUE: Both transabdominal and transvaginal ultrasound examinations were performed for complete evaluation of the gestation as well as the maternal uterus, adnexal regions, and pelvic cul-de-sac. Transvaginal technique was performed to assess early pregnancy.  COMPARISON:  None.  FINDINGS: Intrauterine gestational sac: Visualized/normal in shape.  Yolk sac:  Yes  Embryo:  Yes  Cardiac Activity: Yes  Heart Rate: 118  bpm  CRL:  10.0  mm   7 w   1 d                  US EDC: 10/30/2015  Maternal uterus/adnexae: A small amount of subchorionic hemorrhage is noted. The uterus is otherwise unremarkable.  The ovaries are within normal limits. The right ovary measures 3.6 x 1.6 x 1.5 cm, while the left ovary measures 3.1 x 1.2 x 1.6 cm. No suspicious adnexal masses are seen; there is no evidence for ovarian torsion.  No free fluid is seen within the pelvic cul-de-sac.  IMPRESSION: 1. Single live intrauterine pregnancy noted, with a crown-rump length of 1.0 cm, corresponding to a gestational age of [redacted] weeks 1 day. This does not match the  gestational age by LMP, reflecting a new estimated date of delivery of October 30, 2015. 2. Small amount of subchorionic hemorrhage noted.   Electronically Signed   By: Roanna RaiderJeffery  Chang M.D.   On: 03/14/2015 17:37   [place] Ob Transvaginal  03/14/2015   CLINICAL DATA:  Acute onset of vaginal bleeding.  Initial encounter.  EXAM: OBSTETRIC <14 WK US AND TRANSVAGINAL OB US  TECHNIQUE: Both transabdominal and transvaginal ultrasound examinations were performed for complete evaluation of the gestation as well as the maternal uterus, adnexal regions, and pelvic cul-de-sac. Transvaginal technique was performed to assess early pregnancy.  COMPARISON:  None.  FINDINGS: Intrauterine gestational sac: Visualized/normal in shape.  Yolk sac:  Yes  Embryo:  Yes  Cardiac Activity: Yes  Heart Rate: 118  bpm  CRL:  10.0  mm   7 w   1 d                  [place] EDC: 10/30/2015  Maternal uterus/adnexae: A small amount of subchorionic hemorrhage is noted. The uterus is otherwise unremarkable.  The ovaries are within normal limits. The right ovary measures 3.6 x 1.6 x 1.5 cm, while the left ovary measures 3.1 x 1.2 x 1.6 cm. No suspicious adnexal masses are seen; there is no evidence for ovarian torsion.  No free fluid is seen within the pelvic cul-de-sac.  IMPRESSION: 1. Single live intrauterine pregnancy noted, with a crown-rump length of 1.0 cm, corresponding to a gestational age of [redacted] weeks 1 day. This does not match the gestational age by LMP, reflecting a new estimated date of delivery of October 30, 2015. 2. Small amount of subchorionic hemorrhage noted.   Electronically Signed   By: Roanna Raider M.D.   On: 03/14/2015 17:37   MAU Course  Procedures  MDM Pt was sent to ultrasound to r/o miscarriage. Positive FHT's noted on u/s. Dr Charlotta Newton called and aware of pt ultrasound results. She wants her to follow up in the office in 1 week. Bleeding precautions given to pt. Her bleeding is only spotting at time of discharge.  Assessment and Plan   Bleeding in early pregnancy Abdominal pain in pregnancy  Discharge to home Follow up with Dr Charlotta Newton next week  [facility] [place] 03/14/2015, 4:49 PM

## 2015-03-14 NOTE — Discharge Instructions (Signed)
Vaginal Bleeding During Pregnancy, First Trimester  A small amount of bleeding (spotting) from the vagina is relatively common in early pregnancy. It usually stops on its own. Various things may cause bleeding or spotting in early pregnancy. Some bleeding may be related to the pregnancy, and some may not. In most cases, the bleeding is normal and is not a problem. However, bleeding can also be a sign of something serious. Be sure to tell your health care provider about any vaginal bleeding right away.  Some possible causes of vaginal bleeding during the first trimester include:  · Infection or inflammation of the cervix.  · Growths (polyps) on the cervix.  · Miscarriage or threatened miscarriage.  · Pregnancy tissue has developed outside of the uterus and in a fallopian tube (tubal pregnancy).  · Tiny cysts have developed in the uterus instead of pregnancy tissue (molar pregnancy).  HOME CARE INSTRUCTIONS   Watch your condition for any changes. The following actions may help to lessen any discomfort you are feeling:  · Follow your health care provider's instructions for limiting your activity. If your health care provider orders bed rest, you may need to stay in bed and only get up to use the bathroom. However, your health care provider may allow you to continue light activity.  · If needed, make plans for someone to help with your regular activities and responsibilities while you are on bed rest.  · Keep track of the number of pads you use each day, how often you change pads, and how soaked (saturated) they are. Write this down.  · Do not use tampons. Do not douche.  · Do not have sexual intercourse or orgasms until approved by your health care provider.  · If you pass any tissue from your vagina, save the tissue so you can show it to your health care provider.  · Only take over-the-counter or prescription medicines as directed by your health care provider.  · Do not take aspirin because it can make you  bleed.  · Keep all follow-up appointments as directed by your health care provider.  SEEK MEDICAL CARE IF:  · You have any vaginal bleeding during any part of your pregnancy.  · You have cramps or labor pains.  · You have a fever, not controlled by medicine.  SEEK IMMEDIATE MEDICAL CARE IF:   · You have severe cramps in your back or belly (abdomen).  · You pass large clots or tissue from your vagina.  · Your bleeding increases.  · You feel light-headed or weak, or you have fainting episodes.  · You have chills.  · You are leaking fluid or have a gush of fluid from your vagina.  · You pass out while having a bowel movement.  MAKE SURE YOU:  · Understand these instructions.  · Will watch your condition.  · Will get help right away if you are not doing well or get worse.  Document Released: 07/22/2005 Document Revised: 10/17/2013 Document Reviewed: 06/19/2013  ExitCare® Patient Information ©2015 [employer]. This information is not intended to replace advice given to you by your health care provider. Make sure you discuss any questions you have with your health care provider.

## 2015-05-24 LAB — OB RESULTS CONSOLE GC/CHLAMYDIA
Chlamydia: NEGATIVE
Gonorrhea: NEGATIVE

## 2015-05-24 LAB — OB RESULTS CONSOLE HEPATITIS B SURFACE ANTIGEN: HEP B S AG: NEGATIVE

## 2015-05-24 LAB — OB RESULTS CONSOLE HIV ANTIBODY (ROUTINE TESTING): HIV: NONREACTIVE

## 2015-05-24 LAB — OB RESULTS CONSOLE RPR: RPR: NONREACTIVE

## 2015-05-24 LAB — OB RESULTS CONSOLE ANTIBODY SCREEN: ANTIBODY SCREEN: NEGATIVE

## 2015-05-24 LAB — OB RESULTS CONSOLE ABO/RH: RH TYPE: POSITIVE

## 2015-05-24 LAB — OB RESULTS CONSOLE RUBELLA ANTIBODY, IGM: Rubella: IMMUNE

## 2015-05-27 ENCOUNTER — Other Ambulatory Visit: Payer: Self-pay | Admitting: Obstetrics & Gynecology

## 2015-05-28 LAB — CYTOLOGY - PAP

## 2015-06-04 ENCOUNTER — Other Ambulatory Visit: Payer: 59

## 2015-10-03 LAB — OB RESULTS CONSOLE GBS: STREP GROUP B AG: POSITIVE

## 2015-10-20 ENCOUNTER — Inpatient Hospital Stay (HOSPITAL_COMMUNITY)
Admission: AD | Admit: 2015-10-20 | Discharge: 2015-10-21 | Disposition: A | Discharge status: Home / Self Care | Payer: 59 | Source: Ambulatory Visit | Attending: Obstetrics and Gynecology | Admitting: Obstetrics and Gynecology

## 2015-10-20 ENCOUNTER — Encounter (HOSPITAL_COMMUNITY): Payer: Self-pay | Admitting: *Deleted

## 2015-10-20 DIAGNOSIS — Z3A40 40 weeks gestation of pregnancy: Secondary | ICD-10-CM | POA: Diagnosis not present

## 2015-10-20 DIAGNOSIS — K529 Noninfective gastroenteritis and colitis, unspecified: Secondary | ICD-10-CM

## 2015-10-20 DIAGNOSIS — O4703 False labor before 37 completed weeks of gestation, third trimester: Secondary | ICD-10-CM | POA: Diagnosis not present

## 2015-10-20 HISTORY — DX: Candidiasis of vulva and vagina: B37.3

## 2015-10-20 HISTORY — DX: Acute candidiasis of vulva and vagina: B37.31

## 2015-10-20 MED ORDER — PROMETHAZINE HCL 25 MG PO TABS: 25.0000 mg | ORAL_TABLET | Freq: Four times a day (QID) | ORAL | Status: DC | PRN

## 2015-10-20 MED ORDER — PROMETHAZINE HCL 25 MG/ML IJ SOLN
25.0000 mg | Freq: Once | INTRAMUSCULAR | Status: AC
  Administered 2015-10-20: 25 mg via INTRAVENOUS
  Filled 2015-10-20: qty 1

## 2015-10-20 MED ORDER — ACETAMINOPHEN 325 MG PO TABS
650.0000 mg | ORAL_TABLET | Freq: Once | ORAL | Status: AC
  Administered 2015-10-20: 650 mg via ORAL
  Filled 2015-10-20: qty 2

## 2015-10-20 NOTE — MAU Note (Signed)
Patient presents to mau for c/o contractions and nausea/vomiting. Started today and has progressively increased in severity. Denies LOF, VB at this time. +FM. Last SVE in office was thrusday--patient verbalizes ws 2 cm.

## 2015-10-20 NOTE — MAU Provider Note (Signed)
HPI: Monica Garrett is a 24 y.o. year old 152P1001 female at [redacted]w[redacted]d weeks gestation who presents to MAU for term Labor check. Has been seen by RN. Pt also reported N/V, sick contacts and was found to have low-grade temp of 100.2. Denies LOF or signs of infection. RN discussed labor check, temp, N/V w. Dr. Rana SnareLowe. Not in labor. FHR reactive. OK for for phenergan Tylenol in MAU and D/C.   I medically screened pt and reviewed FHR tracing, VS.   Dorathy KinsmanVirginia Eliannah Garrett, CNM 10/20/2015 11:01 PM

## 2015-10-20 NOTE — Discharge Instructions (Signed)
Viral Gastroenteritis Viral gastroenteritis is also known as stomach flu. This condition affects the stomach and intestinal tract. It can cause sudden diarrhea and vomiting. The illness typically lasts 3 to 8 days. Most people develop an immune response that eventually gets rid of the virus. While this natural response develops, the virus can make you quite ill. CAUSES  Many different viruses can cause gastroenteritis, such as rotavirus or noroviruses. You can catch one of these viruses by consuming contaminated food or water. You may also catch a virus by sharing utensils or other personal items with an infected person or by touching a contaminated surface. SYMPTOMS  The most common symptoms are diarrhea and vomiting. These problems can cause a severe loss of body fluids (dehydration) and a body salt (electrolyte) imbalance. Other symptoms may include:  Fever.  Headache.  Fatigue.  Abdominal pain. DIAGNOSIS  Your caregiver can usually diagnose viral gastroenteritis based on your symptoms and a physical exam. A stool sample may also be taken to test for the presence of viruses or other infections. TREATMENT  This illness typically goes away on its own. Treatments are aimed at rehydration. The most serious cases of viral gastroenteritis involve vomiting so severely that you are not able to keep fluids down. In these cases, fluids must be given through an intravenous line (IV). HOME CARE INSTRUCTIONS   Drink enough fluids to keep your urine clear or pale yellow. Drink small amounts of fluids frequently and increase the amounts as tolerated.  Ask your caregiver for specific rehydration instructions.  Avoid:  Foods high in sugar.  Alcohol.  Carbonated drinks.  Tobacco.  Juice.  Caffeine drinks.  Extremely hot or cold fluids.  Fatty, greasy foods.  Too much intake of anything at one time.  Dairy products until 24 to 48 hours after diarrhea stops.  You may consume probiotics.  Probiotics are active cultures of beneficial bacteria. They may lessen the amount and number of diarrheal stools in adults. Probiotics can be found in yogurt with active cultures and in supplements.  Wash your hands well to avoid spreading the virus.  Only take over-the-counter or prescription medicines for pain, discomfort, or fever as directed by your caregiver. Do not give aspirin to children. Antidiarrheal medicines are not recommended.  Ask your caregiver if you should continue to take your regular prescribed and over-the-counter medicines.  Keep all follow-up appointments as directed by your caregiver. SEEK IMMEDIATE MEDICAL CARE IF:   You are unable to keep fluids down.  You do not urinate at least once every 6 to 8 hours.  You develop shortness of breath.  You notice blood in your stool or vomit. This may look like coffee grounds.  You have abdominal pain that increases or is concentrated in one small area (localized).  You have persistent vomiting or diarrhea.  You have a fever.  The patient is a child younger than 3 months, and he or she has a fever.  The patient is a child older than 3 months, and he or she has a fever and persistent symptoms.  The patient is a child older than 3 months, and he or she has a fever and symptoms suddenly get worse.  The patient is a baby, and he or she has no tears when crying. MAKE SURE YOU:   Understand these instructions.  Will watch your condition.  Will get help right away if you are not doing well or get worse.   This information is not intended to replace  advice given to you by your health care provider. Make sure you discuss any questions you have with your health care provider.   Document Released: 10/12/2005 Document Revised: 01/04/2012 Document Reviewed: 07/29/2011 Elsevier Interactive Patient Education 2016 Elsevier Inc.  Ball CorporationBraxton Hicks Contractions Contractions of the uterus can occur throughout pregnancy.  Contractions are not always a sign that you are in labor.  WHAT ARE BRAXTON HICKS CONTRACTIONS?  Contractions that occur before labor are called Braxton Hicks contractions, or false labor. Toward the end of pregnancy (32-34 weeks), these contractions can develop more often and may become more forceful. This is not true labor because these contractions do not result in opening (dilatation) and thinning of the cervix. They are sometimes difficult to tell apart from true labor because these contractions can be forceful and people have different pain tolerances. You should not feel embarrassed if you go to the hospital with false labor. Sometimes, the only way to tell if you are in true labor is for your health care provider to look for changes in the cervix. If there are no prenatal problems or other health problems associated with the pregnancy, it is completely safe to be sent home with false labor and await the onset of true labor. HOW CAN YOU TELL THE DIFFERENCE BETWEEN TRUE AND FALSE LABOR? False Labor  The contractions of false labor are usually shorter and not as hard as those of true labor.   The contractions are usually irregular.   The contractions are often felt in the front of the lower abdomen and in the groin.   The contractions may go away when you walk around or change positions while lying down.   The contractions get weaker and are shorter lasting as time goes on.   The contractions do not usually become progressively stronger, regular, and closer together as with true labor.  True Labor  Contractions in true labor last 30-70 seconds, become very regular, usually become more intense, and increase in frequency.   The contractions do not go away with walking.   The discomfort is usually felt in the top of the uterus and spreads to the lower abdomen and low back.   True labor can be determined by your health care provider with an exam. This will show that the cervix is  dilating and getting thinner.  WHAT TO REMEMBER  Keep up with your usual exercises and follow other instructions given by your health care provider.   Take medicines as directed by your health care provider.   Keep your regular prenatal appointments.   Eat and drink lightly if you think you are going into labor.   If Braxton Hicks contractions are making you uncomfortable:   Change your position from lying down or resting to walking, or from walking to resting.   Sit and rest in a tub of warm water.   Drink 2-3 glasses of water. Dehydration may cause these contractions.   Do slow and deep breathing several times an hour.  WHEN SHOULD I SEEK IMMEDIATE MEDICAL CARE? Seek immediate medical care if:  Your contractions become stronger, more regular, and closer together.   You have fluid leaking or gushing from your vagina.   You have a fever.   You pass blood-tinged mucus.   You have vaginal bleeding.   You have continuous abdominal pain.   You have low back pain that you never had before.   You feel your baby's head pushing down and causing pelvic pressure.  Your baby is not moving as much as it used to.    This information is not intended to replace advice given to you by your health care provider. Make sure you discuss any questions you have with your health care provider.   Document Released: 10/12/2005 Document Revised: 10/17/2013 Document Reviewed: 07/24/2013 Elsevier Interactive Patient Education [employer]

## 2015-10-25 ENCOUNTER — Encounter (HOSPITAL_COMMUNITY): Payer: Self-pay | Admitting: *Deleted

## 2015-10-25 ENCOUNTER — Telehealth (HOSPITAL_COMMUNITY): Payer: Self-pay | Admitting: *Deleted

## 2015-10-25 LAB — OB RESULTS CONSOLE GBS: GBS: POSITIVE

## 2015-10-25 NOTE — Telephone Encounter (Signed)
Preadmission screen  

## 2015-10-26 ENCOUNTER — Inpatient Hospital Stay (HOSPITAL_COMMUNITY)
Admission: EM | Admit: 2015-10-26 | Discharge: 2015-10-29 | DRG: 774 | Disposition: A | Discharge status: Home / Self Care | Payer: 59 | Source: Ambulatory Visit | Attending: Obstetrics & Gynecology | Admitting: Obstetrics & Gynecology

## 2015-10-26 ENCOUNTER — Encounter (HOSPITAL_COMMUNITY): Payer: Self-pay | Admitting: *Deleted

## 2015-10-26 ENCOUNTER — Inpatient Hospital Stay (HOSPITAL_COMMUNITY): Payer: 59 | Admitting: Anesthesiology

## 2015-10-26 DIAGNOSIS — D62 Acute posthemorrhagic anemia: Secondary | ICD-10-CM | POA: Diagnosis present

## 2015-10-26 DIAGNOSIS — Z809 Family history of malignant neoplasm, unspecified: Secondary | ICD-10-CM | POA: Diagnosis not present

## 2015-10-26 DIAGNOSIS — O99214 Obesity complicating childbirth: Secondary | ICD-10-CM | POA: Diagnosis present

## 2015-10-26 DIAGNOSIS — E669 Obesity, unspecified: Secondary | ICD-10-CM | POA: Diagnosis present

## 2015-10-26 DIAGNOSIS — Z6831 Body mass index (BMI) 31.0-31.9, adult: Secondary | ICD-10-CM

## 2015-10-26 DIAGNOSIS — Z3A39 39 weeks gestation of pregnancy: Secondary | ICD-10-CM

## 2015-10-26 DIAGNOSIS — R509 Fever, unspecified: Secondary | ICD-10-CM | POA: Diagnosis present

## 2015-10-26 DIAGNOSIS — O9902 Anemia complicating childbirth: Secondary | ICD-10-CM | POA: Diagnosis present

## 2015-10-26 DIAGNOSIS — Z3490 Encounter for supervision of normal pregnancy, unspecified, unspecified trimester: Secondary | ICD-10-CM

## 2015-10-26 DIAGNOSIS — Z8249 Family history of ischemic heart disease and other diseases of the circulatory system: Secondary | ICD-10-CM

## 2015-10-26 DIAGNOSIS — O99824 Streptococcus B carrier state complicating childbirth: Secondary | ICD-10-CM | POA: Diagnosis present

## 2015-10-26 DIAGNOSIS — O1092 Unspecified pre-existing hypertension complicating childbirth: Secondary | ICD-10-CM | POA: Diagnosis present

## 2015-10-26 DIAGNOSIS — IMO0001 Reserved for inherently not codable concepts without codable children: Secondary | ICD-10-CM

## 2015-10-26 LAB — CBC
HCT: 35.2 % — ABNORMAL LOW (ref 36.0–46.0)
HEMOGLOBIN: 11.7 g/dL — AB (ref 12.0–15.0)
MCH: 29.3 pg (ref 26.0–34.0)
MCHC: 33.2 g/dL (ref 30.0–36.0)
MCV: 88 fL (ref 78.0–100.0)
PLATELETS: 262 10*3/uL (ref 150–400)
RBC: 4 MIL/uL (ref 3.87–5.11)
RDW: 13.7 % (ref 11.5–15.5)
WBC: 11.5 10*3/uL — AB (ref 4.0–10.5)

## 2015-10-26 MED ORDER — OXYTOCIN 40 UNITS IN LACTATED RINGERS INFUSION - SIMPLE MED
62.5000 mL/h | INTRAVENOUS | Status: DC
Start: 2015-10-26 — End: 2015-10-27
  Filled 2015-10-26: qty 1000

## 2015-10-26 MED ORDER — EPHEDRINE 5 MG/ML INJ: 10.0000 mg | INTRAVENOUS | Status: DC | PRN

## 2015-10-26 MED ORDER — LIDOCAINE HCL (PF) 1 % IJ SOLN
INTRAMUSCULAR | Status: DC | PRN
  Administered 2015-10-26 (×2): 8 mL via EPIDURAL

## 2015-10-26 MED ORDER — PENICILLIN G POTASSIUM 5000000 UNITS IJ SOLR
2.5000 10*6.[IU] | INTRAVENOUS | Status: DC
  Filled 2015-10-26: qty 2.5

## 2015-10-26 MED ORDER — ONDANSETRON HCL 4 MG/2ML IJ SOLN: 4.0000 mg | Freq: Four times a day (QID) | INTRAMUSCULAR | Status: DC | PRN

## 2015-10-26 MED ORDER — LACTATED RINGERS IV SOLN
INTRAVENOUS | Status: DC
  Administered 2015-10-26: 23:00:00 via INTRAVENOUS

## 2015-10-26 MED ORDER — ACETAMINOPHEN 325 MG PO TABS: 650.0000 mg | ORAL_TABLET | ORAL | Status: DC | PRN

## 2015-10-26 MED ORDER — LIDOCAINE HCL (PF) 1 % IJ SOLN
30.0000 mL | INTRAMUSCULAR | Status: DC | PRN
  Filled 2015-10-26: qty 30

## 2015-10-26 MED ORDER — OXYCODONE-ACETAMINOPHEN 5-325 MG PO TABS: 1.0000 | ORAL_TABLET | ORAL | Status: DC | PRN

## 2015-10-26 MED ORDER — SODIUM CHLORIDE 0.9 % IV SOLN
2.0000 g | Freq: Once | INTRAVENOUS | Status: AC
  Administered 2015-10-27: 2 g via INTRAVENOUS
  Filled 2015-10-26: qty 2000

## 2015-10-26 MED ORDER — LACTATED RINGERS IV SOLN: 500.0000 mL | INTRAVENOUS | Status: DC | PRN

## 2015-10-26 MED ORDER — PHENYLEPHRINE 40 MCG/ML (10ML) SYRINGE FOR IV PUSH (FOR BLOOD PRESSURE SUPPORT)
80.0000 ug | PREFILLED_SYRINGE | INTRAVENOUS | Status: DC | PRN
  Filled 2015-10-26: qty 20

## 2015-10-26 MED ORDER — OXYTOCIN BOLUS FROM INFUSION: 500.0000 mL | INTRAVENOUS | Status: DC

## 2015-10-26 MED ORDER — FLEET ENEMA 7-19 GM/118ML RE ENEM: 1.0000 | ENEMA | RECTAL | Status: DC | PRN

## 2015-10-26 MED ORDER — PENICILLIN G POTASSIUM 5000000 UNITS IJ SOLR
5.0000 10*6.[IU] | Freq: Once | INTRAVENOUS | Status: DC
  Filled 2015-10-26: qty 5

## 2015-10-26 MED ORDER — OXYCODONE-ACETAMINOPHEN 5-325 MG PO TABS: 2.0000 | ORAL_TABLET | ORAL | Status: DC | PRN

## 2015-10-26 MED ORDER — FENTANYL 2.5 MCG/ML BUPIVACAINE 1/10 % EPIDURAL INFUSION (WH - ANES)
14.0000 mL/h | INTRAMUSCULAR | Status: DC | PRN
  Administered 2015-10-26 (×2): 14 mL/h via EPIDURAL
  Filled 2015-10-26: qty 125

## 2015-10-26 MED ORDER — CITRIC ACID-SODIUM CITRATE 334-500 MG/5ML PO SOLN: 30.0000 mL | ORAL | Status: DC | PRN

## 2015-10-26 MED ORDER — DIPHENHYDRAMINE HCL 50 MG/ML IJ SOLN: 12.5000 mg | INTRAMUSCULAR | Status: DC | PRN

## 2015-10-26 NOTE — MAU Note (Signed)
PT  SAYS HURT BAD   SINCE  8PM.    DENIES  HSV  .  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  VE  IN OFFICE    4  CM   .  GBS- POSITIVE

## 2015-10-26 NOTE — MAU Note (Signed)
Contractions since 2000. Occ spotting today. Denies LOF

## 2015-10-26 NOTE — Anesthesia Preprocedure Evaluation (Signed)
Anesthesia Evaluation  Patient identified by MRN, date of birth, ID band Patient awake    Reviewed: Allergy & Precautions, H&P , NPO status , Patient's Chart, lab work & pertinent test results  Airway Mallampati: I  TM Distance: >3 FB Neck ROM: full    Dental no notable dental hx.    Pulmonary neg pulmonary ROS,    Pulmonary exam normal        Cardiovascular negative cardio ROS Normal cardiovascular exam     Neuro/Psych negative neurological ROS     GI/Hepatic negative GI ROS, Neg liver ROS,   Endo/Other  negative endocrine ROS  Renal/GU negative Renal ROS     Musculoskeletal   Abdominal (+) + obese,   Peds  Hematology negative hematology ROS (+)   Anesthesia Other Findings   Reproductive/Obstetrics (+) Pregnancy                             Anesthesia Physical Anesthesia Plan  ASA: II  Anesthesia Plan: Epidural   Post-op Pain Management:    Induction:   Airway Management Planned:   Additional Equipment:   Intra-op Plan:   Post-operative Plan:   Informed Consent: I have reviewed the patients History and Physical, chart, labs and discussed the procedure including the risks, benefits and alternatives for the proposed anesthesia with the patient or authorized representative who has indicated his/her understanding and acceptance.     Plan Discussed with:   Anesthesia Plan Comments:         Anesthesia Quick Evaluation  

## 2015-10-26 NOTE — Anesthesia Procedure Notes (Signed)
Epidural Patient location during procedure: OB Start time: 10/26/2015 11:18 PM End time: 10/26/2015 11:22 PM  Staffing Anesthesiologist: Leilani AbleHATCHETT, Hilari Wethington Performed by: anesthesiologist   Preanesthetic Checklist Completed: patient identified, surgical consent, pre-op evaluation, timeout performed, IV checked, risks and benefits discussed and monitors and equipment checked  Epidural Patient position: sitting Prep: site prepped and draped and DuraPrep Patient monitoring: continuous pulse ox and blood pressure Approach: midline Location: L3-L4 Injection technique: LOR air  Needle:  Needle type: Tuohy  Needle gauge: 17 G Needle length: 9 cm and 9 Needle insertion depth: 5 cm cm Catheter type: closed end flexible Catheter size: 19 Gauge Catheter at skin depth: 10 cm Test dose: negative and Other  Assessment Sensory level: T9 Events: blood not aspirated, injection not painful, no injection resistance, negative IV test and no paresthesia  Additional Notes Reason for block:procedure for pain

## 2015-10-27 ENCOUNTER — Encounter (HOSPITAL_COMMUNITY): Payer: Self-pay | Admitting: *Deleted

## 2015-10-27 DIAGNOSIS — E669 Obesity, unspecified: Secondary | ICD-10-CM | POA: Diagnosis not present

## 2015-10-27 DIAGNOSIS — Z3490 Encounter for supervision of normal pregnancy, unspecified, unspecified trimester: Secondary | ICD-10-CM

## 2015-10-27 DIAGNOSIS — R509 Fever, unspecified: Secondary | ICD-10-CM | POA: Diagnosis not present

## 2015-10-27 DIAGNOSIS — Z3A39 39 weeks gestation of pregnancy: Secondary | ICD-10-CM | POA: Diagnosis not present

## 2015-10-27 DIAGNOSIS — O1092 Unspecified pre-existing hypertension complicating childbirth: Secondary | ICD-10-CM | POA: Diagnosis not present

## 2015-10-27 DIAGNOSIS — O99824 Streptococcus B carrier state complicating childbirth: Secondary | ICD-10-CM | POA: Diagnosis not present

## 2015-10-27 DIAGNOSIS — D62 Acute posthemorrhagic anemia: Secondary | ICD-10-CM | POA: Diagnosis not present

## 2015-10-27 LAB — CBC
HEMATOCRIT: 33.2 % — AB (ref 36.0–46.0)
HEMOGLOBIN: 10.9 g/dL — AB (ref 12.0–15.0)
MCH: 29.4 pg (ref 26.0–34.0)
MCHC: 32.8 g/dL (ref 30.0–36.0)
MCV: 89.5 fL (ref 78.0–100.0)
Platelets: 252 10*3/uL (ref 150–400)
RBC: 3.71 MIL/uL — AB (ref 3.87–5.11)
RDW: 13.6 % (ref 11.5–15.5)
WBC: 21.4 10*3/uL — ABNORMAL HIGH (ref 4.0–10.5)

## 2015-10-27 LAB — TYPE AND SCREEN
ABO/RH(D): B POS
ANTIBODY SCREEN: NEGATIVE

## 2015-10-27 LAB — RPR: RPR Ser Ql: NONREACTIVE

## 2015-10-27 LAB — ABO/RH: ABO/RH(D): B POS

## 2015-10-27 MED ORDER — OXYCODONE-ACETAMINOPHEN 5-325 MG PO TABS
1.0000 | ORAL_TABLET | ORAL | Status: DC | PRN
  Administered 2015-10-27 – 2015-10-29 (×3): 1 via ORAL
  Filled 2015-10-27 (×3): qty 1

## 2015-10-27 MED ORDER — METHYLERGONOVINE MALEATE 0.2 MG/ML IJ SOLN
0.2000 mg | Freq: Once | INTRAMUSCULAR | Status: AC
  Administered 2015-10-27: 0.2 mg via INTRAMUSCULAR

## 2015-10-27 MED ORDER — ONDANSETRON HCL 4 MG/2ML IJ SOLN
4.0000 mg | INTRAMUSCULAR | Status: DC | PRN
Start: 2015-10-27 — End: 2015-10-29

## 2015-10-27 MED ORDER — WITCH HAZEL-GLYCERIN EX PADS: 1.0000 "application " | MEDICATED_PAD | CUTANEOUS | Status: DC | PRN

## 2015-10-27 MED ORDER — DIBUCAINE 1 % RE OINT
1.0000 "application " | TOPICAL_OINTMENT | RECTAL | Status: DC | PRN
  Filled 2015-10-27: qty 28

## 2015-10-27 MED ORDER — TETANUS-DIPHTH-ACELL PERTUSSIS 5-2.5-18.5 LF-MCG/0.5 IM SUSP: 0.5000 mL | Freq: Once | INTRAMUSCULAR | Status: DC

## 2015-10-27 MED ORDER — ONDANSETRON HCL 4 MG PO TABS: 4.0000 mg | ORAL_TABLET | ORAL | Status: DC | PRN

## 2015-10-27 MED ORDER — OXYTOCIN 40 UNITS IN LACTATED RINGERS INFUSION - SIMPLE MED: 62.5000 mL/h | INTRAVENOUS | Status: DC

## 2015-10-27 MED ORDER — PRENATAL MULTIVITAMIN CH
1.0000 | ORAL_TABLET | Freq: Every day | ORAL | Status: DC
Start: 2015-10-27 — End: 2015-10-29
  Administered 2015-10-27 – 2015-10-29 (×3): 1 via ORAL
  Filled 2015-10-27 (×3): qty 1

## 2015-10-27 MED ORDER — LANOLIN HYDROUS EX OINT: TOPICAL_OINTMENT | CUTANEOUS | Status: DC | PRN

## 2015-10-27 MED ORDER — PENICILLIN G POTASSIUM 5000000 UNITS IJ SOLR: 5.0000 10*6.[IU] | Freq: Once | INTRAMUSCULAR | Status: DC

## 2015-10-27 MED ORDER — BENZOCAINE-MENTHOL 20-0.5 % EX AERO
1.0000 | INHALATION_SPRAY | CUTANEOUS | Status: DC | PRN
Start: 2015-10-27 — End: 2015-10-29
  Administered 2015-10-27: 1 via TOPICAL
  Filled 2015-10-27 (×2): qty 56

## 2015-10-27 MED ORDER — OXYCODONE-ACETAMINOPHEN 5-325 MG PO TABS: 1.0000 | ORAL_TABLET | ORAL | Status: DC | PRN

## 2015-10-27 MED ORDER — CITRIC ACID-SODIUM CITRATE 334-500 MG/5ML PO SOLN: 30.0000 mL | ORAL | Status: DC | PRN

## 2015-10-27 MED ORDER — FLEET ENEMA 7-19 GM/118ML RE ENEM: 1.0000 | ENEMA | RECTAL | Status: DC | PRN

## 2015-10-27 MED ORDER — ACETAMINOPHEN 325 MG PO TABS: 650.0000 mg | ORAL_TABLET | ORAL | Status: DC | PRN

## 2015-10-27 MED ORDER — IBUPROFEN 600 MG PO TABS
600.0000 mg | ORAL_TABLET | Freq: Four times a day (QID) | ORAL | Status: DC
  Administered 2015-10-27 – 2015-10-29 (×9): 600 mg via ORAL
  Filled 2015-10-27 (×9): qty 1

## 2015-10-27 MED ORDER — MISOPROSTOL 200 MCG PO TABS
ORAL_TABLET | ORAL | Status: AC
  Administered 2015-10-27: 1000 ug
  Filled 2015-10-27: qty 5

## 2015-10-27 MED ORDER — ONDANSETRON HCL 4 MG/2ML IJ SOLN
4.0000 mg | Freq: Four times a day (QID) | INTRAMUSCULAR | Status: DC | PRN
Start: 2015-10-27 — End: 2015-10-27

## 2015-10-27 MED ORDER — LACTATED RINGERS IV SOLN
INTRAVENOUS | Status: DC
  Administered 2015-10-27: 07:00:00 via INTRAVENOUS

## 2015-10-27 MED ORDER — DIPHENHYDRAMINE HCL 25 MG PO CAPS: 25.0000 mg | ORAL_CAPSULE | Freq: Four times a day (QID) | ORAL | Status: DC | PRN

## 2015-10-27 MED ORDER — OXYCODONE-ACETAMINOPHEN 5-325 MG PO TABS: 2.0000 | ORAL_TABLET | ORAL | Status: DC | PRN

## 2015-10-27 MED ORDER — OXYTOCIN BOLUS FROM INFUSION: 500.0000 mL | INTRAVENOUS | Status: DC

## 2015-10-27 MED ORDER — ZOLPIDEM TARTRATE 5 MG PO TABS: 5.0000 mg | ORAL_TABLET | Freq: Every evening | ORAL | Status: DC | PRN

## 2015-10-27 MED ORDER — PENICILLIN G POTASSIUM 5000000 UNITS IJ SOLR: 2.5000 10*6.[IU] | INTRAVENOUS | Status: DC

## 2015-10-27 MED ORDER — LACTATED RINGERS IV SOLN: 500.0000 mL | INTRAVENOUS | Status: DC | PRN

## 2015-10-27 MED ORDER — SIMETHICONE 80 MG PO CHEW: 80.0000 mg | CHEWABLE_TABLET | ORAL | Status: DC | PRN

## 2015-10-27 MED ORDER — LACTATED RINGERS IV SOLN: INTRAVENOUS | Status: DC

## 2015-10-27 MED ORDER — LIDOCAINE HCL (PF) 1 % IJ SOLN: 30.0000 mL | INTRAMUSCULAR | Status: DC | PRN

## 2015-10-27 MED ORDER — SENNOSIDES-DOCUSATE SODIUM 8.6-50 MG PO TABS
2.0000 | ORAL_TABLET | ORAL | Status: DC
  Administered 2015-10-28 – 2015-10-29 (×2): 2 via ORAL
  Filled 2015-10-27 (×2): qty 2

## 2015-10-27 MED ORDER — OXYCODONE-ACETAMINOPHEN 5-325 MG PO TABS
2.0000 | ORAL_TABLET | ORAL | Status: DC | PRN
  Administered 2015-10-27 – 2015-10-28 (×2): 2 via ORAL
  Filled 2015-10-27 (×2): qty 2

## 2015-10-27 NOTE — L&D Delivery Note (Signed)
SVD of VFI at 0121 on 10/27/15.  EBL 250cc.  APGARs 9,9. Head delivered ROA and body followed atraumatically.  Baby to abdomen.  Cord was clamped and cut.  Cord blood was obtained.  Placenta delivered S/I/3VC.  Fundus was firmed with pitocin and massage.  First degree perineal lac was repaired with 3-0 Rapide in single figure of eight stitch.  Mom and baby stable.  Mitchel HonourMegan Ammiel Guiney, DO

## 2015-10-27 NOTE — Progress Notes (Addendum)
Called Dr Langston MaskerMorris about high bps.  Ordered LR to run at 14325ml/hr.jtwells, RN

## 2015-10-27 NOTE — Progress Notes (Signed)
Post Partum Day 0 Subjective: no complaints, up ad lib, voiding and tolerating PO.  Reports decreased VB.  Objective: Blood pressure 126/82, pulse 71, temperature 98.3 F (36.8 C), temperature source Oral, resp. rate 20, height 5\' 9"  (1.753 m), weight 95.255 kg (210 lb), last menstrual period 01/13/2015, SpO2 98 %.  Physical Exam:  General: alert, cooperative and appears stated age Lochia: appropriate Uterine Fundus: firm Incision: healing well, no significant drainage, no dehiscence DVT Evaluation: No evidence of DVT seen on physical exam. Negative Homan's sign. No cords or calf tenderness.   Recent Labs  10/26/15 2235 10/27/15 0450  HGB 11.7* 10.9*  HCT 35.2* 33.2*    Assessment/Plan: Plan for discharge tomorrow  PPH-received 1000mcg cytotec and methergine series.  Will continue to monitor. HTN-likely secondary to methergine-will watch and check CBC, CMET tomorrow AM. Fever-likely secondary to cytotec-will continue to monitor.    LOS: 1 day   Gerik Coberly 10/27/2015, 10:51 AM

## 2015-10-27 NOTE — Lactation Note (Signed)
This note was copied from the chart of Monica Binnie KandJessica Garrett. Lactation Consultation Note; Experienced BF mom reports baby has been nursing pretty well- still having trouble getting her mouth open wide and deep onto the breast. Has been sleepy- reviewed normal newborn behavior the first 24 hours and cluster feeding the second night. Reports she nursed her first baby for 6 Monica Garrett then her milk supply decreased. Baby asleep in bassinet at present and mom states they tried to nurse about 1 hour ago. No questions at present. BF brochure given with resources or support after DC. Is [employer] employee and plans to get pump from us at DC.To call for assist prn   Patient Name: Monica Binnie KandJessica Garrett WUJWJ'XToday's Date: 10/27/2015 Reason for consult: Initial assessment   Maternal Data Formula Feeding for Exclusion: No Does the patient have breastfeeding experience prior to this delivery?: Yes  Feeding   LATCH Score/Interventions     Lactation Tools Discussed/Used     Consult Status Consult Status: Follow-up Date: 10/28/15 Follow-up type: In-patient    Pamelia HoitWeeks, Carlise Stofer D 10/27/2015, 1:27 PM

## 2015-10-27 NOTE — Progress Notes (Addendum)
Report was given that patient passed 3 large, fist-sized clots in toilet. Patient came to M//B w/vital signs of 151/112, pulse of 122, recorded at 0440. Marland Kitchen.  First set of vital signs taken at M/B at 0500 were 137/96, pulse 83, temp 100.7. Patient was given 2 percocet at 0436 by L&Dnurse.  Called to room at 0550; patient vomiting.

## 2015-10-27 NOTE — H&P (Addendum)
Monica Garrett is a 25 y.o. female presenting for SOL.  CTX started at 8pm.  No LOF, VB.  Active FM.  Antepartum course uncomplicated.  GBS positive.  Received epidural but starting to feel pressure.  Maternal Medical History:  Reason for admission: Contractions.   Contractions: Onset was 3-5 hours ago.   Frequency: regular.   Perceived severity is moderate.    Fetal activity: Perceived fetal activity is normal.   Last perceived fetal movement was within the past hour.    Prenatal complications: no prenatal complications Prenatal Complications - Diabetes: none.    OB History    Gravida Para Term Preterm AB TAB SAB Ectopic Multiple Living   2 1 1       1      Past Medical History  Diagnosis Date  . No pertinent past medical history   . Allergy   . Anxiety   . Yeast infection of the vagina   . Hx of varicella   . Vaginal Pap smear, abnormal    Past Surgical History  Procedure Laterality Date  . Wisdom tooth extraction     Family History: family history includes Cancer in her maternal grandmother and paternal grandfather; Hypertension in her father and mother. There is no history of Other. Social History:  reports that she has never smoked. She has never used smokeless tobacco. She reports that she does not drink alcohol or use illicit drugs.   Prenatal Transfer Tool  Maternal Diabetes: No Genetic Screening: Normal Maternal Ultrasounds/Referrals: Normal Fetal Ultrasounds or other Referrals:  None Maternal Substance Abuse:  No Significant Maternal Medications:  None Significant Maternal Lab Results:  Lab values include: Group B Strep positive Other Comments:  None  ROS  Dilation: 8 Effacement (%): 90 Station: -2 Exam by:: C.Okoroji RN-BSN Blood pressure 134/87, pulse 70, temperature 98 F (36.7 C), temperature source Oral, resp. rate 22, height 5\' 9"  (1.753 m), weight 95.255 kg (210 lb), last menstrual period 01/13/2015. Maternal Exam:  Uterine Assessment:  Contraction strength is moderate.  Contraction frequency is regular.   Abdomen: Patient reports no abdominal tenderness. Fundal height is c/w dates.   Estimated fetal weight is 7#12.   Fetal presentation: vertex  Pelvis: adequate for delivery.   Cervix: Cervix evaluated by digital exam.     Physical Exam  Constitutional: She is oriented to person, place, and time. She appears well-developed and well-nourished.  GI: Soft. There is no rebound and no guarding.  Neurological: She is alert and oriented to person, place, and time.  Skin: Skin is warm and dry.  Psychiatric: She has a normal mood and affect. Her behavior is normal.    Prenatal labs: ABO, Rh: --/--/B POS (12/31 2235) Antibody: NEG (12/31 2235) Rubella: Immune (07/29 0000) RPR: Nonreactive (07/29 0000)  HBsAg: Negative (07/29 0000)  HIV: Non-reactive (07/29 0000)  GBS: Positive (12/30 0000)   Assessment/Plan: 24yo G2P1001 at 6028w4d with SOL -Anticipate NSVD  -Ampicillin for GBS ppx  [name] 10/27/2015, 1:35 AM

## 2015-10-27 NOTE — Anesthesia Postprocedure Evaluation (Signed)
Anesthesia Post Note  Patient: Monica Garrett  Procedure(s) Performed: * No procedures listed *  Patient location during evaluation: Mother Baby Anesthesia Type: Epidural Level of consciousness: awake and alert, oriented and patient cooperative Pain management: pain level controlled Vital Signs Assessment: post-procedure vital signs reviewed and stable Respiratory status: spontaneous breathing Cardiovascular status: stable Postop Assessment: no headache, epidural receding, patient able to bend at knees and no signs of nausea or vomiting Anesthetic complications: no    Last Vitals:  Filed Vitals:   10/27/15 0500 10/27/15 0601  BP: 137/96 147/105  Pulse: 83 85  Temp: 38.2 C 37.1 C  Resp:      Last Pain:  Filed Vitals:   10/27/15 0744  PainSc: 0-No pain                 Starlet Gallentine

## 2015-10-28 LAB — CBC WITH DIFFERENTIAL/PLATELET
BASOS ABS: 0 10*3/uL (ref 0.0–0.1)
BASOS PCT: 0 %
EOS ABS: 0.4 10*3/uL (ref 0.0–0.7)
Eosinophils Relative: 4 %
HEMATOCRIT: 26.5 % — AB (ref 36.0–46.0)
HEMOGLOBIN: 8.9 g/dL — AB (ref 12.0–15.0)
Lymphocytes Relative: 28 %
Lymphs Abs: 3.3 10*3/uL (ref 0.7–4.0)
MCH: 30 pg (ref 26.0–34.0)
MCHC: 33.6 g/dL (ref 30.0–36.0)
MCV: 89.2 fL (ref 78.0–100.0)
MONOS PCT: 4 %
Monocytes Absolute: 0.5 10*3/uL (ref 0.1–1.0)
NEUTROS ABS: 7.5 10*3/uL (ref 1.7–7.7)
NEUTROS PCT: 64 %
Platelets: 203 10*3/uL (ref 150–400)
RBC: 2.97 MIL/uL — AB (ref 3.87–5.11)
RDW: 14.1 % (ref 11.5–15.5)
WBC: 11.7 10*3/uL — AB (ref 4.0–10.5)

## 2015-10-28 LAB — COMPREHENSIVE METABOLIC PANEL
ALBUMIN: 2.3 g/dL — AB (ref 3.5–5.0)
ALK PHOS: 102 U/L (ref 38–126)
ALT: 14 U/L (ref 14–54)
ANION GAP: 6 (ref 5–15)
AST: 15 U/L (ref 15–41)
BUN: 15 mg/dL (ref 6–20)
CALCIUM: 7.9 mg/dL — AB (ref 8.9–10.3)
CO2: 24 mmol/L (ref 22–32)
Chloride: 106 mmol/L (ref 101–111)
Creatinine, Ser: 0.84 mg/dL (ref 0.44–1.00)
GFR calc Af Amer: >60 mL/min (ref >60)
GFR calc non Af Amer: >60 mL/min (ref >60)
GLUCOSE: 86 mg/dL (ref 65–99)
Potassium: 4.5 mmol/L (ref 3.5–5.1)
Sodium: 136 mmol/L (ref 135–145)
Total Bilirubin: 0.2 mg/dL — ABNORMAL LOW (ref 0.3–1.2)
Total Protein: 5.3 g/dL — ABNORMAL LOW (ref 6.5–8.1)

## 2015-10-28 MED ORDER — FERROUS SULFATE 325 (65 FE) MG PO TABS
325.0000 mg | ORAL_TABLET | Freq: Two times a day (BID) | ORAL | Status: DC
  Administered 2015-10-28 – 2015-10-29 (×3): 325 mg via ORAL
  Filled 2015-10-28 (×3): qty 1

## 2015-10-28 NOTE — Progress Notes (Addendum)
Post Partum Day 1 Subjective: no complaints, up ad lib, voiding and tolerating PO. No HA, CP/SOB, RUQ pain, or visual disturbance.  Objective: Blood pressure 123/81, pulse 74, temperature 98.3 F (36.8 C), temperature source Oral, resp. rate 18, height 5\' 9"  (1.753 m), weight 95.255 kg (210 lb), last menstrual period 01/13/2015, SpO2 98 %, unknown if currently breastfeeding.  Physical Exam:  General: alert, cooperative and appears stated age Lochia: appropriate Uterine Fundus: firm Incision: healing well, no significant drainage, no dehiscence DVT Evaluation: No evidence of DVT seen on physical exam. Negative Homan's sign. No cords or calf tenderness.   Recent Labs  10/27/15 0450 10/28/15 0530  HGB 10.9* 8.9*  HCT 33.2* 26.5*    Assessment/Plan: Plan for discharge tomorrow and Breastfeeding  Acute blood loss anemia-ferrous sulfate Elevated BPs-resolved over past 24 hours c/w methergine series etiology.  Will continue to monitor.  Pre-E labs wnl. Fever-resolved.  AF x 24 hours c/w cytotoc etiology.  WBC normalized.   LOS: 2 days   Dilpreet Faires 10/28/2015, 7:59 AM

## 2015-10-28 NOTE — Lactation Note (Signed)
This note was copied from the chart of Girl Binnie KandJessica Wurzer. Lactation Consultation Note  Patient Name: Girl Binnie KandJessica Rocha NWGNF'AToday's Date: 10/28/2015 Reason for consult: Follow-up assessment Infant is 35 hours hold [facility]. Mom had just finished with the photographer & stated infant was ready to feed. Mom tried latching baby on right breast in football hold. Baby latched initially but then would unlatch slightly but then latch back on above her nipple. Mom reported no pain but no swallows were heard (could not tell if nipple was actually in infant's mouth). When baby came off, she had been sucking just above her nipple, which left a bruise/ sore spot. Baby came off in ~10 mins, mom changed diaper & infant kept pooping while they were changing her. Once finished, suggested mom try latching again. This time mom tried on left breast in football hold - again, baby latched and then slipped above the nipple & left a sore spot above her nipple. Explained what was happening & encouraged mom to completely unlatch if infant comes off slightly so that she can ensure that she is getting her nipple in infant's mouth and that she's not slipping to above the nipple. FOB asked about flange size - tried hand pump on right breast & nipple was rubbing so tried 27mm but still rubbed so tried 30mm and those fit better & mom reported less pain. Mom & FOB used spoon to feed the milk she expressed while figuring out proper flange sizes. While trying to get baby back on left breast in football hold, mom expressed that her hand was sore from holding her breast so LC assisted with latch. After several attempts, baby latched and swallows were heard. Mom reported a little pain at times but was unsure if it was due to improper latch or previous sores infant had caused. Once baby had a good latch, mom reported minimal pain. Mom took over and LC explained what to look for as far as her nipple shape when infant  unlatches & suggested she could hand express or use hand pump prior to BF to initiate a let-down if the strong sucks in the beginning were too much. Provided mom with comfort gels to help heal the sores and help with any discomfort. Mom & FOB reported no further questions. Encouraged mom to ask for LC at future feeds if desired.  Note- LC initially started charting at the wrong time, 1:30pm information is correct information.  Maternal Data    Feeding Feeding Type: Breast Fed Length of feed: 5 min  LATCH Score/Interventions Latch: Repeated attempts needed to sustain latch, nipple held in mouth throughout feeding, stimulation needed to elicit sucking reflex. Intervention(s): Adjust position;Assist with latch  Audible Swallowing: A few with stimulation Intervention(s): Hand expression Intervention(s): Alternate breast massage  Type of Nipple: Everted at rest and after stimulation  Comfort (Breast/Nipple): Filling, red/small blisters or bruises, mild/mod discomfort Problem noted: Cracked, bleeding, blisters, bruises Intervention(s): Expressed breast milk to nipple     Hold (Positioning): Assistance needed to correctly position infant at breast and maintain latch. Intervention(s): Support Pillows;Position options  LATCH Score: 6  Lactation Tools Discussed/Used     Consult Status Consult Status: Follow-up Date: 10/29/15 Follow-up type: In-patient    Oneal GroutLaura C Darienne Belleau 10/28/2015, 2:18 PM

## 2015-10-28 NOTE — Clinical Social Work Psychosocial (Signed)
CLINICAL SOCIAL WORK MATERNAL/CHILD NOTE  Patient Details  Name: Monica Garrett MRN: [id number] Date of Birth: 10/27/2017  Date: 10/28/2015  Clinical Social Worker Initiating Note: Eduard Clos, LCSWADate/ Time Initiated: 10/28/15/1042   Child's Name: Monica Garrett   Legal Guardian: Father   Need for Interpreter:     Date of Referral: 10/28/15   Reason for Referral: Other (Comment) (Prenatal record of hx of anxiety)   Referral Source: Physician   Address: [address]  Phone number:  [phone])   Household Members: Self, Spouse, Minor Children   Natural Supports (not living in the home): Community, Extended Family   Professional Supports:None   Employment:Full-time   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 Patient (MOB) holding newborn in bed. CSW introduced self and role- permission rec'd from MOB to speak with her in front of husband/FOB. MOB reports a history of anxiety which she minimized; stating it was in the past and not "a big issue". She also shared with CSW that she took medication for the anxiety for a short while but found it to "make the anxiety worse". She reports that deep breathing techniques were helpful to reduce her feelings and she has continued to use this when feels the need. MOB reports they have a 10yo son, Eulas Post, who has met his baby sister- "he wasn't too sure about her". MOB reports and  advised for increased opportunity for anxiety with a newborn in the home.  CSW educated her on her risk for this post partum, as well as to be aware and watch for any signs of PPD. Husband/FOB also acknowledges his willingness to be aware of and cognizant of any signs/symptoms observed.    CSW Plan/Description:   NO further needs identified or voiced- MOB reports they have extended family and friends who are local and supportive as well as all needed supplies for Newborn.   Ludwig Clarks, LCSW 10/28/2015, 10:56 AM

## 2015-10-29 ENCOUNTER — Encounter (HOSPITAL_COMMUNITY): Payer: 59

## 2015-10-29 ENCOUNTER — Inpatient Hospital Stay (HOSPITAL_COMMUNITY): Admission: RE | Admit: 2015-10-29 | Payer: 59 | Source: Ambulatory Visit

## 2015-10-29 MED ORDER — FERROUS SULFATE 325 (65 FE) MG PO TABS: 325.0000 mg | ORAL_TABLET | Freq: Two times a day (BID) | ORAL | Status: DC

## 2015-10-29 MED ORDER — OXYCODONE-ACETAMINOPHEN 5-325 MG PO TABS: 1.0000 | ORAL_TABLET | ORAL | Status: DC | PRN

## 2015-10-29 MED ORDER — IBUPROFEN 600 MG PO TABS: 600.0000 mg | ORAL_TABLET | Freq: Four times a day (QID) | ORAL | Status: DC

## 2015-10-29 NOTE — Lactation Note (Signed)
This note was copied from the chart of Monica Garrett. Lactation Consultation Note  Mother's nipples are bruised so she is primarily pumping and bottle feeding at this time. Offered to help with latch if mother decides to breastfeed today but states she is still very sore. Mother has comfort gels.   She is [employer] employee.  FOB will go downstairs to pick up pump today. Reviewed engorgement care and monitoring voids/stools.   Patient Name: Monica Binnie KandJessica Clasby WGNFA'OToday's Date: 10/29/2015 Reason for consult: Follow-up assessment   Maternal Data    Feeding Feeding Type: Bottle Fed - Breast Milk  LATCH Score/Interventions                      Lactation Tools Discussed/Used     Consult Status Consult Status: Complete    Hardie PulleyBerkelhammer, Tan Clopper Boschen 10/29/2015, 8:23 AM

## 2015-10-29 NOTE — Discharge Summary (Signed)
Obstetric Discharge Summary Reason for Admission: onset of labor Prenatal Procedures: ultrasound Intrapartum Procedures: spontaneous vaginal delivery Postpartum Procedures: methergine and cytotec protocol Complications-Operative and Postpartum: 1 degree perineal laceration HEMOGLOBIN  Date Value Ref Range Status  10/28/2015 8.9* 12.0 - 15.0 g/dL Final   HCT  Date Value Ref Range Status  10/28/2015 26.5* 36.0 - 46.0 % Final    Physical Exam:  General: alert and cooperative Lochia: appropriate Uterine Fundus: firm Incision: healing well DVT Evaluation: No evidence of DVT seen on physical exam. Negative Homan's sign. No cords or calf tenderness. No significant calf/ankle edema.  Discharge Diagnoses: Term Pregnancy-delivered, postpartum hemorrhage  Discharge Information: Date: 10/29/2015 Activity: pelvic rest Diet: routine Medications: PNV, Ibuprofen, Colace, Iron and Percocet Condition: stable Instructions: refer to practice specific booklet Discharge to: home   Newborn Data: Live born female  Birth Weight: 7 lb 0.9 oz (3200 g) APGAR: 8, 9  Home with mother.  Claudett Bayly G 10/29/2015, 8:08 AM

## 2015-12-09 DIAGNOSIS — Z1389 Encounter for screening for other disorder: Secondary | ICD-10-CM | POA: Diagnosis not present

## 2015-12-19 DIAGNOSIS — R87810 Cervical high risk human papillomavirus (HPV) DNA test positive: Secondary | ICD-10-CM | POA: Diagnosis not present

## 2015-12-19 DIAGNOSIS — Z3200 Encounter for pregnancy test, result unknown: Secondary | ICD-10-CM | POA: Diagnosis not present

## 2015-12-19 DIAGNOSIS — R87610 Atypical squamous cells of undetermined significance on cytologic smear of cervix (ASC-US): Secondary | ICD-10-CM | POA: Diagnosis not present

## 2015-12-25 DIAGNOSIS — Z3200 Encounter for pregnancy test, result unknown: Secondary | ICD-10-CM | POA: Diagnosis not present

## 2015-12-25 DIAGNOSIS — Z30430 Encounter for insertion of intrauterine contraceptive device: Secondary | ICD-10-CM | POA: Diagnosis not present

## 2016-02-05 DIAGNOSIS — Z30431 Encounter for routine checking of intrauterine contraceptive device: Secondary | ICD-10-CM | POA: Diagnosis not present

## 2016-03-16 ENCOUNTER — Ambulatory Visit (INDEPENDENT_AMBULATORY_CARE_PROVIDER_SITE_OTHER): Payer: 59 | Admitting: Physician Assistant

## 2016-03-16 VITALS — BP 112/72 | HR 72 | Temp 98.8°F | Resp 16 | Ht 69.0 in | Wt 185.0 lb

## 2016-03-16 DIAGNOSIS — L301 Dyshidrosis [pompholyx]: Secondary | ICD-10-CM | POA: Diagnosis not present

## 2016-03-16 MED ORDER — TRIAMCINOLONE ACETONIDE 0.5 % EX OINT: 1.0000 "application " | TOPICAL_OINTMENT | Freq: Two times a day (BID) | CUTANEOUS | Status: DC

## 2016-03-16 MED ORDER — CEPHALEXIN 500 MG PO CAPS: 500.0000 mg | ORAL_CAPSULE | Freq: Two times a day (BID) | ORAL | Status: DC

## 2016-03-16 NOTE — Patient Instructions (Addendum)
     IF you received an x-ray today, you will receive an invoice from [facility]. Please contact [facility] at [phone] with questions or concerns regarding your invoice.   IF you received labwork today, you will receive an invoice from [facility]. Please contact [facility] at [phone] with questions or concerns regarding your invoice.   Our billing staff will not be able to assist you with questions regarding bills from these companies.  You will be contacted with the lab results as soon as they are available. The fastest way to get your results is to activate your My Chart account. Instructions are located on the last page of this paperwork. If you have not heard from us regarding the results in 2 weeks, please contact this office.   Hand Dermatitis Hand dermatitis (dyshidrotic eczema) is a skin condition in which small, itchy, raised dots or fluid-filled blisters form over the palms of the hands. Outbreaks of hand dermatitis can last 3 to 4 weeks. CAUSES  The cause of hand dermatitis is unknown. However, it occurs most often in patients with a history of allergies such as:  Hay fever.  Allergic asthma.  Allergies to latex. Chemical exposure, injuries, and environmental irritants can make hand dermatitis worse. Washing your hands too frequently can remove natural oils, which can dry out the skin and contribute to outbreaks of hand dermatitis. SYMPTOMS  The most common symptom of hand dermatitis is intense itching. Cracks or grooves (fissures) on the fingers can also develop. Affected areas can be painful, especially areas where large blisters have formed. DIAGNOSIS Your caregiver can usually tell what the problem is by doing a physical exam. PREVENTION  Avoid excessive hand washing.  Avoid the use of harsh chemicals.  Wear protective gloves when handling products that can irritate your skin. TREATMENT  Steroid creams and  ointments, such as over-the-counter 1% hydrocortisone cream, can reduce inflammation and improve moisture retention. These should be applied at least 2 to 4 times per day. Your caregiver may ask you to use a stronger prescription steroid cream to help speed the healing of blistered and cracked skin. In severe cases, oral steroid medicine may be needed. If you have an infection, antibiotics may be needed. Your caregiver may also prescribe antihistamines. These medicines help reduce itching. HOME CARE INSTRUCTIONS  Only take over-the-counter or prescription medicines as directed by your caregiver.  You may use wet or cold compresses. This can help:  Alleviate itching.  Increase the effectiveness of topical creams.  Minimize blisters. SEEK MEDICAL CARE IF:  The rash is not better after 1 week of treatment.  Signs of infection develop, such as redness, tenderness, or yellowish-white fluid (pus).  The rash is spreading.   This information is not intended to replace advice given to you by your health care provider. Make sure you discuss any questions you have with your health care provider.   Document Released: 10/12/2005 Document Revised: 01/04/2012 Document Reviewed: 04/26/2015 Elsevier Interactive Patient Education [employer]2016 Elsevier Inc.

## 2016-03-20 NOTE — Progress Notes (Signed)
[facility] [address], [place] [address] [phone]  Date:  03/16/2016   Name:  Monica Garrett   DOB:  January 20, 1991   MRN:  [id number]  PCP:  No primary care provider on file.   Chief Complaint  Patient presents with  . Rash    on pinky, red and itching/ x 2 wks    History of Present Illness:  Monica HeidelbergJessica L Garrett is a 25 y.o. female patient [facility] Patient reports that she has had dryness and swelling along the fifth finger. It has been itchy and recently turned red. This is been happening for approximately 2 weeks however she has had this come on intermittently. Patient works as a LawyerCNA in the hospital. She is constantly washing her hands. She has had a dryness in this area before she used over-the-counter hydrocortisone, however this has grossly progressed into swelling. There is been one incident where there was infection diagnosed in this fifth finger. She has no numbness or tingling. She has had no fever.  She keeps hands covered when handling abrasive chemicals.     Patient Active Problem List   Diagnosis Date Noted  . Pregnancy 10/27/2015  . Active labor 10/26/2015    Past Medical History  Diagnosis Date  . No pertinent past medical history   . Allergy   . Anxiety   . Yeast infection of the vagina   . Hx of varicella   . Vaginal Pap smear, abnormal     Past Surgical History  Procedure Laterality Date  . Wisdom tooth extraction      Social History  Substance Use Topics  . Smoking status: Never Smoker   . Smokeless tobacco: Never Used  . Alcohol Use: No    Family History  Problem Relation Age of Onset  . Other Neg Hx   . Hypertension Mother   . Hypertension Father   . Cancer Maternal Grandmother     ovarian  . Cancer Paternal Grandfather     pancreatic    No Known Allergies  Medication list has been reviewed and updated.  Current Outpatient Prescriptions on File  Prior to Visit  Medication Sig Dispense Refill  . oxyCODONE-acetaminophen (PERCOCET/ROXICET) 5-325 MG tablet Take 1 tablet by mouth every 4 (four) hours as needed (for pain scale 4-7). (Patient not taking: Reported on 03/16/2016) 30 tablet 0   No current facility-administered medications on file prior to visit.    ROS ROS otherwise unremarkable unless listed above.    Physical Examination: BP 112/72 mmHg  Pulse 72  Temp(Src) 98.8 F (37.1 C) (Oral)  Resp 16  Ht 5\' 9"  (1.753 m)  Wt 185 lb (83.915 kg)  BMI 27.31 kg/m2  SpO2 100%  LMP 02/28/2016 Ideal Body Weight: Weight in (lb) to have BMI = 25: 168.9  Physical Exam  Constitutional: She is oriented to person, place, and time. She appears well-developed and well-nourished. No distress.  HENT:  Head: Normocephalic and atraumatic.  Right Ear: External ear normal.  Left Ear: External ear normal.  Eyes: Conjunctivae and EOM are normal. Pupils are equal, round, and reactive to light.  Cardiovascular: Normal rate.   Pulmonary/Chest: Effort normal. No respiratory distress.  Neurological: She is alert and oriented to person, place, and time.  Skin: She is not diaphoretic.  Fifth finger of right hand with swelling down to the MCP. Erythematous papules along the swelling along the lateral sides. She  has normal strength and sensation intact.  Psychiatric: She has a normal mood and affect. Her behavior is normal.     Assessment and Plan: Monica Garrett is a 25 y.o. female who is here today For rash of the fifth right finger. This appears to be dyshidrotic eczema given history and exam. I'm advising that she use triamcinolone ointment and keep it covered at this time. Voiding as much handwashing as she can in her medical physician. I'm also giving her Keflex at this time to guard for possible infection.  Use kenalog no longer than 2 weeks. 1. Dyshidrotic eczema - triamcinolone ointment (KENALOG) 0.5 %; Apply 1 application topically 2  (two) times daily.  Dispense: 30 g; Refill: 0 - cephALEXin (KEFLEX) 500 MG capsule; Take 1 capsule (500 mg total) by mouth 2 (two) times daily.  Dispense: 14 capsule; Refill: 0   Trena Platt, PA-C [facility] 03/20/2016 8:36 PM

## 2016-09-28 ENCOUNTER — Encounter (INDEPENDENT_AMBULATORY_CARE_PROVIDER_SITE_OTHER): Payer: 59 | Admitting: Family Medicine

## 2016-09-29 ENCOUNTER — Ambulatory Visit (INDEPENDENT_AMBULATORY_CARE_PROVIDER_SITE_OTHER): Payer: 59 | Admitting: Sports Medicine

## 2016-09-29 ENCOUNTER — Encounter (INDEPENDENT_AMBULATORY_CARE_PROVIDER_SITE_OTHER): Payer: Self-pay | Admitting: Sports Medicine

## 2016-09-29 VITALS — BP 121/82 | HR 81 | Temp 99.3°F | Ht 69.0 in | Wt 181.0 lb

## 2016-09-29 DIAGNOSIS — H6501 Acute serous otitis media, right ear: Secondary | ICD-10-CM | POA: Diagnosis not present

## 2016-09-29 MED ORDER — AMOXICILLIN 875 MG PO TABS: 875.0000 mg | ORAL_TABLET | Freq: Two times a day (BID) | ORAL | 0 refills | Status: DC

## 2016-09-29 MED ORDER — FLUCONAZOLE 150 MG PO TABS: 150.0000 mg | ORAL_TABLET | Freq: Once | ORAL | 0 refills | Status: AC

## 2016-09-29 MED ORDER — FLUCONAZOLE 150 MG PO TABS: 150.0000 mg | ORAL_TABLET | Freq: Once | ORAL | 0 refills | Status: DC

## 2016-09-29 MED FILL — AMOXICILLIN 875 MG TABLET: 875 | 10 days supply | Qty: 20 | Fill #0

## 2016-09-29 MED FILL — FLUCONAZOLE 150 MG TABLET: 150 | 2 days supply | Qty: 2 | Fill #0

## 2016-09-29 NOTE — Addendum Note (Signed)
Addended by: Gaspar BiddingIGBY, Fumie Fiallo D on: 09/29/2016 03:12 PM   Modules accepted: Orders

## 2016-09-29 NOTE — Patient Instructions (Signed)
[place]alfed 12 hour OTC And Aleeve as needed

## 2016-09-29 NOTE — Progress Notes (Signed)
   Monica HeidelbergJessica L Garrett - 25 y.o. female MRN [id number]  Date of birth: Dec 31, 1990  Office Visit Note: Visit Date: 09/29/2016 PCP: No PCP Per Patient Referred by: No ref. provider found  Subjective: Chief Complaint  Patient presents with  . Cough/congestion    Patient states coughing for 4 days. Headache, sore throat, and runny nose.   HPI: 4 days of worsening congestion, cough & difficulty hearing.: She denies any significant fevers but has been staying awake at night due to productive cough. 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 ROS: Denies any chest pain, shortness of breath history of breathing issues.. Otherwise per HPI.   Clinical History: No specialty comments available.  She reports that she has never smoked. She has never used smokeless tobacco.  No results for input(s): HGBA1C, LABURIC in the last 8760 hours.  Assessment & Plan: Visit Diagnoses:    ICD-9-CM ICD-10-CM   1. Right acute serous otitis media, recurrence not specified 381.01 H65.01     Plan: Given the findings on physical exam treatment for his a right otitis media is warranted. Additional over-the-counter symptom management with Aleve & pseudoephedrine.  Diflucan for ABX associated yeast vaginitis.  Follow-up: Return if symptoms worsen or fail to improve.  Meds:  Meds ordered this encounter  Medications  . amoxicillin (AMOXIL) 875 MG tablet    Sig: Take 1 tablet (875 mg total) by mouth 2 (two) times daily.    Dispense:  20 tablet    Refill:  0  . fluconazole (DIFLUCAN) 150 MG tablet    Sig: Take 1 tablet (150 mg total) by mouth once. Repeat if needed    Dispense:  2 tablet    Refill:  0   Procedures: No notes on file   Objective:  VS:  HT:5\' 9"  (175.3 cm)   WT:181 lb (82.1 kg)  BMI:26.8    BP:121/82  HR:81bpm  TEMP:99.3 F (37.4 C)( )  RESP:  Physical Exam: Adult female. No acute distress. Alert &  appropriate. Heart: Regular rate & rhythm, S1-S2 heard, no murmur. Lungs: Clear to auscultation bilaterally. Head & neck: Right TM markedly erythematous with bulging serous effusion.  Normal left TM. Posterior oropharyngeal streaking. Slight nasal turbinate bogginess & exudate. Painful frontal & maxillary sinuses. Imaging: No results found.  Past Medical/Family/Surgical/Social History: Medications & Allergies reviewed per EMR Patient Active Problem List   Diagnosis Date Noted  . Pregnancy 10/27/2015  . Active labor 10/26/2015   Past Medical History:  Diagnosis Date  . Allergy   . Anxiety   . Hx of varicella   . No pertinent past medical history   . Vaginal Pap smear, abnormal   . Yeast infection of the vagina    Family History  Problem Relation Age of Onset  . Other Neg Hx   . Hypertension Mother   . Hypertension Father   . Cancer Maternal Grandmother     ovarian  . Cancer Paternal Grandfather     pancreatic   Past Surgical History:  Procedure Laterality Date  . WISDOM TOOTH EXTRACTION     Social History   Occupational History  . Not on file.   Social History Main Topics  . Smoking status: Never Smoker  . Smokeless tobacco: Never Used  . Alcohol use No  . Drug use: No  . Sexual activity: Yes    Birth control/ protection: None

## 2016-09-30 ENCOUNTER — Ambulatory Visit (INDEPENDENT_AMBULATORY_CARE_PROVIDER_SITE_OTHER): Payer: 59 | Admitting: Sports Medicine

## 2016-10-15 ENCOUNTER — Ambulatory Visit (INDEPENDENT_AMBULATORY_CARE_PROVIDER_SITE_OTHER): Payer: 59 | Admitting: Family Medicine

## 2016-12-31 DIAGNOSIS — E782 Mixed hyperlipidemia: Secondary | ICD-10-CM | POA: Diagnosis not present

## 2016-12-31 DIAGNOSIS — R5383 Other fatigue: Secondary | ICD-10-CM | POA: Diagnosis not present

## 2016-12-31 DIAGNOSIS — E119 Type 2 diabetes mellitus without complications: Secondary | ICD-10-CM | POA: Diagnosis not present

## 2016-12-31 DIAGNOSIS — Z6828 Body mass index (BMI) 28.0-28.9, adult: Secondary | ICD-10-CM | POA: Diagnosis not present

## 2016-12-31 DIAGNOSIS — D518 Other vitamin B12 deficiency anemias: Secondary | ICD-10-CM | POA: Diagnosis not present

## 2016-12-31 DIAGNOSIS — E559 Vitamin D deficiency, unspecified: Secondary | ICD-10-CM | POA: Diagnosis not present

## 2016-12-31 DIAGNOSIS — E038 Other specified hypothyroidism: Secondary | ICD-10-CM | POA: Diagnosis not present

## 2016-12-31 DIAGNOSIS — I10 Essential (primary) hypertension: Secondary | ICD-10-CM | POA: Diagnosis not present

## 2016-12-31 MED FILL — PHENTERMINE 37.5 MG TABLET: 37.5 | 30 days supply | Qty: 30 | Fill #0

## 2017-01-29 DIAGNOSIS — R5383 Other fatigue: Secondary | ICD-10-CM | POA: Diagnosis not present

## 2017-01-29 DIAGNOSIS — E559 Vitamin D deficiency, unspecified: Secondary | ICD-10-CM | POA: Diagnosis not present

## 2017-01-29 DIAGNOSIS — Z6827 Body mass index (BMI) 27.0-27.9, adult: Secondary | ICD-10-CM | POA: Diagnosis not present

## 2017-01-29 MED FILL — PHENTERMINE 37.5 MG TABLET: 37.5 | 30 days supply | Qty: 30 | Fill #0

## 2017-02-26 DIAGNOSIS — Z6828 Body mass index (BMI) 28.0-28.9, adult: Secondary | ICD-10-CM | POA: Diagnosis not present

## 2017-02-26 DIAGNOSIS — R5383 Other fatigue: Secondary | ICD-10-CM | POA: Diagnosis not present

## 2017-03-03 MED FILL — PHENTERMINE 37.5 MG TABLET: 37.5 | 30 days supply | Qty: 30 | Fill #0

## 2017-03-26 DIAGNOSIS — R51 Headache: Secondary | ICD-10-CM | POA: Diagnosis not present

## 2017-03-26 DIAGNOSIS — R5383 Other fatigue: Secondary | ICD-10-CM | POA: Diagnosis not present

## 2017-03-26 DIAGNOSIS — Z6827 Body mass index (BMI) 27.0-27.9, adult: Secondary | ICD-10-CM | POA: Diagnosis not present

## 2017-03-26 MED FILL — TOPIRAMATE 50 MG TABLET: 50 | 30 days supply | Qty: 30 | Fill #0

## 2017-04-09 MED FILL — PHENTERMINE 37.5 MG TABLET: 37.5 | 30 days supply | Qty: 30 | Fill #0

## 2018-12-25 ENCOUNTER — Encounter (HOSPITAL_COMMUNITY): Payer: Self-pay | Admitting: Emergency Medicine

## 2018-12-25 ENCOUNTER — Other Ambulatory Visit: Payer: Self-pay

## 2018-12-25 ENCOUNTER — Emergency Department (HOSPITAL_COMMUNITY): Payer: BLUE CROSS/BLUE SHIELD

## 2018-12-25 ENCOUNTER — Ambulatory Visit (HOSPITAL_COMMUNITY)
Admission: EM | Admit: 2018-12-25 | Discharge: 2018-12-25 | Disposition: A | Discharge status: Home / Self Care | Payer: BLUE CROSS/BLUE SHIELD | Source: Home / Self Care

## 2018-12-25 ENCOUNTER — Emergency Department (HOSPITAL_COMMUNITY)
Admission: EM | Admit: 2018-12-25 | Discharge: 2018-12-25 | Disposition: A | Discharge status: Home / Self Care | Payer: BLUE CROSS/BLUE SHIELD | Attending: Emergency Medicine | Admitting: Emergency Medicine

## 2018-12-25 DIAGNOSIS — N10 Acute tubulo-interstitial nephritis: Secondary | ICD-10-CM | POA: Diagnosis not present

## 2018-12-25 DIAGNOSIS — R112 Nausea with vomiting, unspecified: Secondary | ICD-10-CM

## 2018-12-25 DIAGNOSIS — K5900 Constipation, unspecified: Secondary | ICD-10-CM | POA: Diagnosis not present

## 2018-12-25 DIAGNOSIS — N12 Tubulo-interstitial nephritis, not specified as acute or chronic: Secondary | ICD-10-CM

## 2018-12-25 DIAGNOSIS — R109 Unspecified abdominal pain: Secondary | ICD-10-CM

## 2018-12-25 LAB — URINALYSIS, ROUTINE W REFLEX MICROSCOPIC
Bilirubin Urine: NEGATIVE
Glucose, UA: NEGATIVE mg/dL
Ketones, ur: NEGATIVE mg/dL
Nitrite: NEGATIVE
PROTEIN: NEGATIVE mg/dL
Specific Gravity, Urine: 1.015 (ref 1.005–1.030)
pH: 5 (ref 5.0–8.0)

## 2018-12-25 LAB — COMPREHENSIVE METABOLIC PANEL
ALK PHOS: 105 U/L (ref 38–126)
ALT: 106 U/L — AB (ref 0–44)
AST: 145 U/L — ABNORMAL HIGH (ref 15–41)
Albumin: 4.1 g/dL (ref 3.5–5.0)
Anion gap: 7 (ref 5–15)
BILIRUBIN TOTAL: 0.9 mg/dL (ref 0.3–1.2)
BUN: 9 mg/dL (ref 6–20)
CALCIUM: 9 mg/dL (ref 8.9–10.3)
CO2: 23 mmol/L (ref 22–32)
CREATININE: 0.83 mg/dL (ref 0.44–1.00)
Chloride: 107 mmol/L (ref 98–111)
GFR calc non Af Amer: >60 mL/min (ref >60)
Glucose, Bld: 69 mg/dL — ABNORMAL LOW (ref 70–99)
Potassium: 3.2 mmol/L — ABNORMAL LOW (ref 3.5–5.1)
SODIUM: 137 mmol/L (ref 135–145)
Total Protein: 7.8 g/dL (ref 6.5–8.1)

## 2018-12-25 LAB — CBC WITH DIFFERENTIAL/PLATELET
ABS IMMATURE GRANULOCYTES: 0.04 10*3/uL (ref 0.00–0.07)
Basophils Absolute: 0 10*3/uL (ref 0.0–0.1)
Basophils Relative: 0 %
Eosinophils Absolute: 0.1 10*3/uL (ref 0.0–0.5)
Eosinophils Relative: 1 %
HEMATOCRIT: 43.9 % (ref 36.0–46.0)
HEMOGLOBIN: 14 g/dL (ref 12.0–15.0)
Immature Granulocytes: 0 %
LYMPHS ABS: 0.9 10*3/uL (ref 0.7–4.0)
LYMPHS PCT: 8 %
MCH: 29.4 pg (ref 26.0–34.0)
MCHC: 31.9 g/dL (ref 30.0–36.0)
MCV: 92.2 fL (ref 80.0–100.0)
MONO ABS: 1.3 10*3/uL — AB (ref 0.1–1.0)
MONOS PCT: 10 %
NEUTROS ABS: 9.9 10*3/uL — AB (ref 1.7–7.7)
Neutrophils Relative %: 81 %
Platelets: 284 10*3/uL (ref 150–400)
RBC: 4.76 MIL/uL (ref 3.87–5.11)
RDW: 12.3 % (ref 11.5–15.5)
WBC: 12.3 10*3/uL — ABNORMAL HIGH (ref 4.0–10.5)
nRBC: 0 % (ref 0.0–0.2)

## 2018-12-25 LAB — I-STAT BETA HCG BLOOD, ED (MC, WL, AP ONLY)

## 2018-12-25 LAB — POC OCCULT BLOOD, ED: FECAL OCCULT BLD: POSITIVE — AB

## 2018-12-25 LAB — LIPASE, BLOOD: Lipase: 32 U/L (ref 11–51)

## 2018-12-25 MED ORDER — KETOROLAC TROMETHAMINE 30 MG/ML IJ SOLN
30.0000 mg | Freq: Once | INTRAMUSCULAR | Status: AC
  Administered 2018-12-25: 30 mg via INTRAVENOUS
  Filled 2018-12-25: qty 1

## 2018-12-25 MED ORDER — HYDROCODONE-ACETAMINOPHEN 5-325 MG PO TABS: 1.0000 | ORAL_TABLET | Freq: Four times a day (QID) | ORAL | 0 refills | Status: DC | PRN

## 2018-12-25 MED ORDER — SODIUM CHLORIDE 0.9 % IV BOLUS
1000.0000 mL | Freq: Once | INTRAVENOUS | Status: AC
  Administered 2018-12-25: 1000 mL via INTRAVENOUS

## 2018-12-25 MED ORDER — PROMETHAZINE HCL 25 MG PO TABS: 25.0000 mg | ORAL_TABLET | Freq: Four times a day (QID) | ORAL | 0 refills | Status: DC | PRN

## 2018-12-25 MED ORDER — ONDANSETRON 4 MG PO TBDP: 4.0000 mg | ORAL_TABLET | Freq: Three times a day (TID) | ORAL | 0 refills | Status: DC | PRN

## 2018-12-25 MED ORDER — MORPHINE SULFATE (PF) 4 MG/ML IV SOLN
4.0000 mg | Freq: Once | INTRAVENOUS | Status: AC
  Administered 2018-12-25: 4 mg via INTRAVENOUS
  Filled 2018-12-25: qty 1

## 2018-12-25 MED ORDER — IOHEXOL 300 MG/ML  SOLN
100.0000 mL | Freq: Once | INTRAMUSCULAR | Status: AC | PRN
  Administered 2018-12-25: 100 mL via INTRAVENOUS

## 2018-12-25 MED ORDER — ONDANSETRON HCL 4 MG/2ML IJ SOLN
4.0000 mg | Freq: Once | INTRAMUSCULAR | Status: AC
  Administered 2018-12-25: 4 mg via INTRAVENOUS
  Filled 2018-12-25: qty 2

## 2018-12-25 MED ORDER — PROMETHAZINE HCL 25 MG/ML IJ SOLN
25.0000 mg | Freq: Once | INTRAMUSCULAR | Status: AC
  Administered 2018-12-25: 25 mg via INTRAVENOUS
  Filled 2018-12-25: qty 1

## 2018-12-25 MED ORDER — POLYETHYLENE GLYCOL 3350 17 G PO PACK: 17.0000 g | PACK | Freq: Every day | ORAL | 0 refills | Status: DC

## 2018-12-25 MED ORDER — NAPROXEN 500 MG PO TABS: 500.0000 mg | ORAL_TABLET | Freq: Two times a day (BID) | ORAL | 0 refills | Status: DC | PRN

## 2018-12-25 MED ORDER — SODIUM CHLORIDE 0.9 % IV SOLN
1.0000 g | Freq: Once | INTRAVENOUS | Status: AC
  Administered 2018-12-25: 1 g via INTRAVENOUS
  Filled 2018-12-25: qty 10

## 2018-12-25 MED ORDER — CEPHALEXIN 500 MG PO CAPS: 1000.0000 mg | ORAL_CAPSULE | Freq: Two times a day (BID) | ORAL | 0 refills | Status: AC

## 2018-12-25 NOTE — ED Notes (Signed)
Sent to ER by [place]

## 2018-12-25 NOTE — ED Provider Notes (Signed)
[facility] EMERGENCY DEPARTMENT Provider Note   CSN: [id number] Arrival date & time: 12/25/18  1219    History   Chief Complaint Chief Complaint  Patient presents with  . Constipation  . Emesis  . Nausea    HPI    Monica Garrett is a 28 y.o. female with a PMHx of anxiety, who presents to the ED with complaints of constipation for the last week with associated abdominal pain.  Patient states that she has not had a bowel movement in 1 week, she has had some watery stool come out when she has tried to have a bowel movement, but no substantial BM.  She has tried over-the-counter laxatives and enemas without relief.  No known aggravating factors for her constipation.  She states that along with the constipation she has had some left-sided abdominal pain that was not as bad until yesterday when it got worse.  She describes this pain as 8/10 constant stabbing left-sided abdominal pain that radiates to her lower back, worse with eating, and improved with ibuprofen.  She states that today she started having nausea and one episode of nonbloody nonbilious emesis which concerned her so she came for evaluation.  She denies any fevers, chills, chest pain, shortness of breath, diarrhea, obstipation, melena, hematochezia, rectal pain, dysuria, hematuria, vaginal bleeding or discharge, myalgias, arthralgias, numbness, tingling, focal weakness, or any other complaints at this time.  She is not on any narcotics.  She denies any recent travel, sick contacts, suspicious food intake, alcohol use, NSAID use, or prior abdominal surgeries.  She does not have a PCP at this time.  The history is provided by the patient and medical records. No language interpreter was used.  Constipation  Associated symptoms: abdominal pain, nausea and vomiting   Associated symptoms: no diarrhea, no dysuria and no fever   Emesis  Associated symptoms: abdominal pain   Associated symptoms: no arthralgias, no  chills, no diarrhea, no fever and no myalgias     Past Medical History:  Diagnosis Date  . Allergy   . Anxiety   . Hx of varicella   . No pertinent past medical history   . Vaginal Pap smear, abnormal   . Yeast infection of the vagina     Patient Active Problem List   Diagnosis Date Noted  . Pregnancy 10/27/2015  . Active labor 10/26/2015    Past Surgical History:  Procedure Laterality Date  . WISDOM TOOTH EXTRACTION       OB History    Gravida  2   Para  2   Term  2   Preterm      AB      Living  2     SAB      TAB      Ectopic      Multiple  0   Live Births  2            Home Medications    Prior to Admission medications   Medication Sig Start Date End Date Taking? Authorizing Provider  amoxicillin (AMOXIL) 875 MG tablet Take 1 tablet (875 mg total) by mouth 2 (two) times daily. 09/29/16   Andrena Mews, DO  cephALEXin (KEFLEX) 500 MG capsule Take 1 capsule (500 mg total) by mouth 2 (two) times daily. Patient not taking: Reported on 09/29/2016 03/16/16   Trena Platt D, PA  oxyCODONE-acetaminophen (PERCOCET/ROXICET) 5-325 MG tablet Take 1 tablet by mouth every 4 (four) hours as needed (for  pain scale 4-7). Patient not taking: Reported on 09/29/2016 10/29/15   Julio Sicks, NP  triamcinolone ointment (KENALOG) 0.5 % Apply 1 application topically 2 (two) times daily. Patient not taking: Reported on 09/29/2016 03/16/16   Garnetta Buddy, PA    Family History Family History  Problem Relation Age of Onset  . Hypertension Mother   . Hypertension Father   . Cancer Maternal Grandmother        ovarian  . Cancer Paternal Grandfather        pancreatic  . Other Neg Hx     Social History Social History   Tobacco Use  . Smoking status: Never Smoker  . Smokeless tobacco: Never Used  Substance Use Topics  . Alcohol use: No  . Drug use: No     Allergies   Patient has no known allergies.   Review of Systems Review of Systems    Constitutional: Negative for chills and fever.  Respiratory: Negative for shortness of breath.   Cardiovascular: Negative for chest pain.  Gastrointestinal: Positive for abdominal pain, constipation, nausea and vomiting. Negative for anal bleeding, blood in stool, diarrhea and rectal pain.  Genitourinary: Negative for dysuria, hematuria, vaginal bleeding and vaginal discharge.  Musculoskeletal: Negative for arthralgias and myalgias.  Skin: Negative for color change.  Allergic/Immunologic: Negative for immunocompromised state.  Neurological: Negative for weakness and numbness.  Psychiatric/Behavioral: Negative for confusion.   All other systems reviewed and are negative for acute change except as noted in the HPI.    Physical Exam Updated Vital Signs BP 127/79 (BP Location: Left Arm)   Pulse 90   Temp 98.2 F (36.8 C) (Oral)   Resp 16   Ht  (1.753 m)   Wt 83.9 kg   SpO2 100%   BMI 27.32 kg/m   Physical Exam Vitals signs and nursing note reviewed. Exam conducted with a chaperone present.  Constitutional:      General: She is not in acute distress.    Appearance: Normal appearance. She is well-developed. She is not toxic-appearing.     Comments: Afebrile, nontoxic, NAD  HENT:     Head: Normocephalic and atraumatic.  Eyes:     General:        Right eye: No discharge.        Left eye: No discharge.     Conjunctiva/sclera: Conjunctivae normal.  Neck:     Musculoskeletal: Normal range of motion and neck supple.  Cardiovascular:     Rate and Rhythm: Normal rate and regular rhythm.     Pulses: Normal pulses.     Heart sounds: Normal heart sounds, S1 normal and S2 normal. No murmur. No friction rub. No gallop.   Pulmonary:     Effort: Pulmonary effort is normal. No respiratory distress.     Breath sounds: Normal breath sounds. No decreased breath sounds, wheezing, rhonchi or rales.  Abdominal:     General: Bowel sounds are decreased. There is no distension.      Palpations: Abdomen is soft. Abdomen is not rigid.     Tenderness: There is abdominal tenderness in the left upper quadrant and left lower quadrant. There is left CVA tenderness. There is no right CVA tenderness, guarding or rebound. Negative signs include Murphy's sign and McBurney's sign.     Comments: Soft, nondistended, +BS throughout although slightly hypoactive, with moderate L sided abd TTP both upper and lower quadrants, no r/g/r, neg murphy's, neg mcburney's, +L sided CVA TTP   Genitourinary:  Exam position: Knee-chest position.     Rectum: Guaiac result positive. Tenderness (discomfort) present. No mass, anal fissure, external hemorrhoid or internal hemorrhoid. Normal anal tone.     Comments: Chaperone present No gross blood noted on rectal exam, no fecal impaction, normal tone, no focal tenderness but some discomfort with rectal exam, no mass or fissure, no hemorrhoids. FOBT+ Musculoskeletal: Normal range of motion.  Skin:    General: Skin is warm and dry.     Findings: No rash.  Neurological:     Mental Status: She is alert and oriented to person, place, and time.     Sensory: Sensation is intact. No sensory deficit.     Motor: Motor function is intact.  Psychiatric:        Mood and Affect: Mood and affect normal.        Behavior: Behavior normal.      ED Treatments / Results  Labs (all labs ordered are listed, but only abnormal results are displayed) Labs Reviewed  CBC WITH DIFFERENTIAL/PLATELET - Abnormal; Notable for the following components:      Result Value   WBC 12.3 (*)    Neutro Abs 9.9 (*)    Monocytes Absolute 1.3 (*)    All other components within normal limits  COMPREHENSIVE METABOLIC PANEL - Abnormal; Notable for the following components:   Potassium 3.2 (*)    Glucose, Bld 69 (*)    AST 145 (*)    ALT 106 (*)    All other components within normal limits  URINALYSIS, ROUTINE W REFLEX MICROSCOPIC - Abnormal; Notable for the following components:    Hgb urine dipstick MODERATE (*)    Leukocytes,Ua SMALL (*)    WBC, UA >50 (*)    Bacteria, UA RARE (*)    All other components within normal limits  POC OCCULT BLOOD, ED - Abnormal; Notable for the following components:   Fecal Occult Bld POSITIVE (*)    All other components within normal limits  URINE CULTURE  LIPASE, BLOOD  I-STAT BETA HCG BLOOD, ED ([facility], [facility], [facility] ONLY)    EKG None  Radiology Ct Abdomen Pelvis W Contrast  Result Date: 12/25/2018 CLINICAL DATA:  28 year old female with acute LEFT abdominal pain and constipation for 1 week. EXAM: CT ABDOMEN AND PELVIS WITH CONTRAST TECHNIQUE: Multidetector CT imaging of the abdomen and pelvis was performed using the standard protocol following bolus administration of intravenous contrast. CONTRAST:  OMNIPAQUE IOHEXOL 300 MG/ML  SOLN COMPARISON:  None. FINDINGS: Lower chest: No acute abnormality Hepatobiliary: The liver and gallbladder are unremarkable. No biliary dilatation. Pancreas: Unremarkable Spleen: Unremarkable Adrenals/Urinary Tract: Subtle wedge-shaped areas of decreased enhancement within the LEFT kidney noted and compatible with pyelonephritis. No renal abscess, hydronephrosis or obstructing renal calculi. Mild enhancement of the LEFT ureteral wall is compatible with infection changes. The RIGHT kidney, adrenal glands and bladder are unremarkable. Stomach/Bowel: Stomach is within normal limits. No evidence of bowel wall thickening, distention, or inflammatory changes. Vascular/Lymphatic: No significant vascular findings are present. No enlarged abdominal or pelvic lymph nodes. Reproductive: Uterus and bilateral adnexa are unremarkable. Other: No abdominal wall hernia or abnormality. No abdominopelvic ascites. Musculoskeletal: No acute or suspicious bony abnormalities. IMPRESSION: 1. LEFT pyelonephritis.  No renal abscess or hydronephrosis. 2. No evidence of bowel obstruction. Electronically Signed   By: Harmon Pier M.D.   On:  12/25/2018 16:17    Procedures Procedures (including critical care time)  Medications Ordered in ED Medications  ondansetron (ZOFRAN) injection 4 mg (  4 mg Intravenous Given 12/25/18 1309)  sodium chloride 0.9 % bolus 1,000 mL (0 mLs Intravenous Stopped 12/25/18 1441)  ketorolac (TORADOL) 30 MG/ML injection 30 mg (30 mg Intravenous Given 12/25/18 1309)  iohexol (OMNIPAQUE) 300 MG/ML solution 100 mL (100 mLs Intravenous Contrast Given 12/25/18 1536)  promethazine (PHENERGAN) injection 25 mg (25 mg Intravenous Given 12/25/18 1711)  cefTRIAXone (ROCEPHIN) 1 g in sodium chloride 0.9 % 100 mL IVPB (0 g Intravenous Stopped 12/25/18 1752)  morphine 4 MG/ML injection 4 mg (4 mg Intravenous Given 12/25/18 1752)     Initial Impression / Assessment and Plan / ED Course  I have reviewed the triage vital signs and the nursing notes.  Pertinent labs & imaging results that were available during my care of the patient were reviewed by me and considered in my medical decision making (see chart for details).        28 y.o. female here with constipation for the last week with abdominal pain that got worse yesterday, and then today started having nausea and vomiting.  On exam, moderate left upper and lower quadrant tenderness, non-peritoneal, slightly hypoactive bowel sounds throughout, +L flank tenderness, rectal exam reveals no hemorrhoids or fecal impaction.  Concern for small bowel obstruction.  Discussed options of proceeding with x-ray imaging first versus proceeding with CT scan, and patient would rather proceed with CT scan to get more definitive answer.  Will give nausea and pain medicine, fluids, and reassess shortly.  5:13 PM FOBT+. CBC w/diff with mildly elevated WBC 12.3 but otherwise unremarkable without any drop in H/H, doubt we need to worry about the FOBT result at this point. CMP with mildly low K 3.2, gluc 69, AST 145, ALT 106 but otherwise unremarkable; no RUQ tenderness, unclear etiology of mild  transaminitis. Lipase WNL. U/A with no nitrites, small leuks, >50 WBCs but rare bacteria, which is odd, will send for UCx but proceed with treatment. BetaHCG neg. CT abd/pelv showing left sided pyelonephritis, consistent with the U/A findings. Pt still hurting and having nausea, will give morphine and phenergan. Will reassess after that.   6:49 PM Pt feeling better and tolerating PO. Will send home with abx, pain meds, nausea meds, and miralax. Advised increased water and fiber intake.  Follow-up with PCP in 1 week for reassessment. I explained the diagnosis and have given explicit precautions to return to the ER including for any other new or worsening symptoms. The patient understands and accepts the medical plan as it's been dictated and I have answered their questions. Discharge instructions concerning home care and prescriptions have been given. The patient is STABLE and is discharged to home in good condition.    Final Clinical Impressions(s) / ED Diagnoses   Final diagnoses:  Pyelonephritis of left kidney  Left sided abdominal pain  Nausea and vomiting in adult  Constipation, unspecified constipation type    ED Discharge Orders         Ordered    cephALEXin (KEFLEX) 500 MG capsule  2 times daily     12/25/18 1848    ondansetron (ZOFRAN ODT) 4 MG disintegrating tablet  Every 8 hours PRN     12/25/18 1848    promethazine (PHENERGAN) 25 MG tablet  Every 6 hours PRN     12/25/18 1848    polyethylene glycol (MIRALAX / GLYCOLAX) packet  Daily     12/25/18 1848    HYDROcodone-acetaminophen (NORCO) 5-325 MG tablet  Every 6 hours PRN     12/25/18 1848  naproxen (NAPROSYN) 500 MG tablet  2 times daily PRN     12/25/18 [address], [place], [place] 12/25/18 1850    Pricilla Loveless, MD 12/26/18 [phone]

## 2018-12-25 NOTE — ED Notes (Signed)
Patient transported to CT 

## 2018-12-25 NOTE — ED Notes (Signed)
"  Patient verbalizes understanding of discharge instructions. Opportunity for questioning and answers were provided.  pt discharged from ED ."  

## 2018-12-25 NOTE — Discharge Instructions (Signed)
You have an infection in your kidney which is causing the symptoms.  Take antibiotics as directed until completed.  Take Zofran and/or Phenergan as needed for nausea.  Alternate between naproxen and Norco as directed, as needed for pain, but do not drive or operate machinery while taking norco.  Use MiraLAX as directed to help with constipation.  Stay well-hydrated and get plenty of rest.  Increase water and fiber intake to help with constipation. Follow-up with your regular doctor in the next 1 week for reassessment.  Return to the ER for emergent changes or worsening in symptoms.

## 2018-12-25 NOTE — ED Triage Notes (Signed)
Pt reports constipation x7days, last BM Sunday. Reports 1 episode of n/v today, also c/o l upper abd pain. Sent by urgent care for possible bowl obstruction

## 2018-12-27 LAB — URINE CULTURE: Culture: 70000 — AB

## 2018-12-28 ENCOUNTER — Telehealth: Payer: Self-pay | Admitting: *Deleted

## 2018-12-28 NOTE — Telephone Encounter (Signed)
Post ED Visit - Positive Culture Follow-up  Culture report reviewed by antimicrobial stewardship pharmacist: [facility] []  Enzo Bi, Pharm.D. []  Celedonio Miyamoto, Pharm.D., BCPS AQ-ID []  Garvin Fila, Pharm.D., BCPS []  Georgina Pillion, Pharm.D., BCPS []  [place], [place].D., BCPS, AAHIVP []  Estella Husk, Pharm.D., BCPS, AAHIVP []  Lysle Pearl, PharmD, BCPS []  Phillips Climes, PharmD, BCPS []  Agapito Games, PharmD, BCPS []  Verlan Friends, PharmD []  Mervyn Gay, PharmD, BCPS []  Vinnie Level, PharmD Wendelyn Breslow, PharmD  [facility] []  Len Childs, PharmD []  Greer Pickerel, PharmD []  Adalberto Cole, PharmD []  Perlie Gold, Rph []  Lonell Face) Jean Rosenthal, PharmD []  Earl Many, PharmD []  Junita Push, PharmD []  Dorna Leitz, PharmD []  Terrilee Files, PharmD []  Lynann Beaver, PharmD []  Keturah Barre, PharmD []  Loralee Pacas, PharmD []  Bernadene Person, PharmD   Positive urine culture Treated with Cephalexin, organism sensitive to the same and no further patient follow-up is required at this time.  [employer] 12/28/2018, 8:59 AM

## 2019-01-16 ENCOUNTER — Telehealth: Payer: BLUE CROSS/BLUE SHIELD | Admitting: Family

## 2019-01-16 DIAGNOSIS — R6889 Other general symptoms and signs: Secondary | ICD-10-CM

## 2019-01-16 DIAGNOSIS — Z7189 Other specified counseling: Secondary | ICD-10-CM

## 2019-01-16 MED ORDER — BENZONATATE 100 MG PO CAPS: 100.0000 mg | ORAL_CAPSULE | Freq: Three times a day (TID) | ORAL | 0 refills | Status: DC | PRN

## 2019-01-16 NOTE — Progress Notes (Signed)
E-Visit for Corona Virus Screening  Based on your current symptoms, you may very well have the virus, however your symptoms are mild. Currently, not all patients are being tested. If the symptoms are mild and there is not a known exposure, performing the test is not indicated.   Approximately 5 minutes was spent documenting and reviewing patient's chart.    Coronavirus disease 2019 (COVID-19) is a respiratory illness that can spread from person to person. The virus that causes COVID-19 is a new virus that was first identified in the country of [place] but is now found in multiple other countries and has spread to the [place].  Symptoms associated with the virus are mild to severe fever, cough, and shortness of breath. There is currently no vaccine to protect against COVID-19, and there is no specific antiviral treatment for the virus.   To be considered HIGH RISK for Coronavirus (COVID-19), you have to meet the following criteria:  . Traveled to [place], [place], [place], [place] or [place]; or in the [place] to [place], [place], [place], or [place]; and have fever, cough, and shortness of breath within the last 2 weeks of travel OR  . Been in close contact with a person diagnosed with COVID-19 within the last 2 weeks and have fever, cough, and shortness of breath  . IF YOU DO NOT MEET THESE CRITERIA, YOU ARE CONSIDERED LOW RISK FOR COVID-19.   It is vitally important that if you feel that you have an infection such as this virus or any other virus that you stay home and away from places where you may spread it to others.  You should self-quarantine for 14 days if you have symptoms that could potentially be coronavirus and avoid contact with people age 65 and older.   You can use medication such as A prescription cough medication called Tessalon Perles 100 mg. You may take 1-2 capsules every 8 hours as needed for cough  You may also take acetaminophen (Tylenol) as needed for  fever.   Reduce your risk of any infection by using the same precautions used for avoiding the common cold or flu:  . Wash your hands often with soap and warm water for at least 20 seconds.  If soap and water are not readily available, use an alcohol-based hand sanitizer with at least 60% alcohol.  . If coughing or sneezing, cover your mouth and nose by coughing or sneezing into the elbow areas of your shirt or coat, into a tissue or into your sleeve (not your hands). . Avoid shaking hands with others and consider head nods or verbal greetings only. . Avoid touching your eyes, nose, or mouth with unwashed hands.  . Avoid close contact with people who are sick. . Avoid places or events with large numbers of people in one location, like concerts or sporting events. . Carefully consider travel plans you have or are making. . If you are planning any travel outside or inside the [place], visit the CDC's Travelers' Health webpage for the latest health notices. . If you have some symptoms but not all symptoms, continue to monitor at home and seek medical attention if your symptoms worsen. . If you are having a medical emergency, call 911.  HOME CARE . Only take medications as instructed by your medical team. . Drink plenty of fluids and get plenty of rest. . A steam or ultrasonic humidifier can help if you have congestion.   GET HELP RIGHT AWAY IF: .   You develop worsening fever. . You become short of breath . You cough up blood. . Your symptoms become more severe MAKE SURE YOU   Understand these instructions.  Will watch your condition.  Will get help right away if you are not doing well or get worse.  Your e-visit answers were reviewed by a board certified advanced clinical practitioner to complete your personal care plan.  Depending on the condition, your plan could have included both over the counter or prescription medications.  If there is a problem please reply once you have received a  response from your provider. Your safety is important to us.  If you have drug allergies check your prescription carefully.    You can use MyChart to ask questions about today's visit, request a non-urgent call back, or ask for a work or school excuse for 24 hours related to this e-Visit. If it has been greater than 24 hours you will need to follow up with your provider, or enter a new e-Visit to address those concerns. You will get an e-mail in the next two days asking about your experience.  I hope that your e-visit has been valuable and will speed your recovery. Thank you for using e-visits.    

## 2019-04-21 ENCOUNTER — Ambulatory Visit: Payer: BLUE CROSS/BLUE SHIELD | Admitting: Family Medicine

## 2020-10-26 DIAGNOSIS — Z419 Encounter for procedure for purposes other than remedying health state, unspecified: Secondary | ICD-10-CM | POA: Diagnosis not present

## 2020-11-26 DIAGNOSIS — Z419 Encounter for procedure for purposes other than remedying health state, unspecified: Secondary | ICD-10-CM | POA: Diagnosis not present

## 2020-12-24 DIAGNOSIS — Z419 Encounter for procedure for purposes other than remedying health state, unspecified: Secondary | ICD-10-CM | POA: Diagnosis not present

## 2021-01-08 ENCOUNTER — Ambulatory Visit (INDEPENDENT_AMBULATORY_CARE_PROVIDER_SITE_OTHER): Payer: Medicaid Other | Admitting: Obstetrics & Gynecology

## 2021-01-08 ENCOUNTER — Encounter: Payer: Self-pay | Admitting: Obstetrics & Gynecology

## 2021-01-08 ENCOUNTER — Other Ambulatory Visit: Payer: Self-pay | Admitting: Obstetrics & Gynecology

## 2021-01-08 ENCOUNTER — Other Ambulatory Visit: Payer: Self-pay

## 2021-01-08 ENCOUNTER — Other Ambulatory Visit (HOSPITAL_COMMUNITY)
Admission: RE | Admit: 2021-01-08 | Discharge: 2021-01-08 | Disposition: A | Discharge status: Home / Self Care | Payer: Medicaid Other | Source: Ambulatory Visit | Attending: Obstetrics & Gynecology | Admitting: Obstetrics & Gynecology

## 2021-01-08 VITALS — BP 120/73 | HR 71 | Ht 69.0 in | Wt 196.0 lb

## 2021-01-08 DIAGNOSIS — N644 Mastodynia: Secondary | ICD-10-CM

## 2021-01-08 DIAGNOSIS — Z30011 Encounter for initial prescription of contraceptive pills: Secondary | ICD-10-CM

## 2021-01-08 DIAGNOSIS — Z01419 Encounter for gynecological examination (general) (routine) without abnormal findings: Secondary | ICD-10-CM | POA: Insufficient documentation

## 2021-01-08 DIAGNOSIS — N6310 Unspecified lump in the right breast, unspecified quadrant: Secondary | ICD-10-CM

## 2021-01-08 MED ORDER — NORGESTIMATE-ETH ESTRADIOL 0.25-35 MG-MCG PO TABS: 1.0000 | ORAL_TABLET | Freq: Every day | ORAL | 12 refills | Status: DC

## 2021-01-08 NOTE — Progress Notes (Signed)
New GYN presents for AEX/PAP and IUD removal. She wants OCP. c/o cramping, painful sex 5/10 x 2+ months.

## 2021-01-08 NOTE — Patient Instructions (Signed)
Oral Contraception Information Oral contraceptive pills (OCPs) are medicines taken by mouth to prevent pregnancy. They work by:  Preventing the ovaries from releasing eggs.  Thickening mucus in the lower part of the uterus (cervix). This prevents sperm from entering the uterus.  Thinning the lining of the uterus (endometrium). This prevents a fertilized egg from attaching to the endometrium. OCPs are highly effective when taken exactly as prescribed. However, OCPs do not prevent STIs (sexually transmitted infections). Using condoms while on an OCP can help prevent STIs. What happens before starting OCPs? Before you start taking OCPs:  You may have a physical exam, blood test, and Pap test.  Your health care provider will make sure you are a good candidate for oral contraception. OCPs are not a good option for certain women, such as: ? Women who smoke and are older than age 35. ? Women who have or have had certain conditions, such as:  A history of high blood pressure.  Deep vein thrombosis.  Pulmonary embolism.  Stroke.  Cardiovascular disease.  Peripheral vascular disease. Ask your health care provider about the possible side effects of the OCP you may be prescribed. Be aware that it can take 2-3 months for your body to adjust to changes in hormone levels. Types of oral contraception Birth control pills contain the hormones estrogen and progestin (synthetic progesterone) or progestin only. The combination pill This type of pill contains estrogen and progestin hormones.  Conventional contraception pills come in packs of 21 or 28 pills. ? Some packs with 28-day pills contain estrogen and progestin for the first 21-24 days. Hormone-free tablets, called placebos, are taken for the final 4-7 days. You should have menstrual bleeding during the time you take the placebos. ? In packs with 21 tablets, you take no pills for 7 days. Menstrual bleeding occurs during these days. (Some people  prefer taking a pill for 28 days to help establish a routine).  Extended-interval contraception pills come in packs of 91 pills. The first 84 tablets have both estrogen and progestin. The last 7 pills are placebos. Menstrual bleeding occurs during the placebo days. With this schedule, menstrual bleeding happens once every 3 months.  Continuous contraception pills come in packs of 28 pills. All pills in the pack contain estrogen and progestin. With this schedule, regular menstrual bleeding does not happen, but there may be spotting or irregular bleeding. Progestin-only pills This type of pill is often called the mini-pill and contains the progestin hormone only. It comes in packs of 28 pills. In some packs, the last 4 pills are placebos. The pill must be taken at the same time every day. This is very important to prevent pregnancy. Menstrual bleeding may not be regular or predictable.   What are the advantages? Oral contraception provides reliable and continuous contraception if taken as directed. It may treat or decrease symptoms of:  Menstrual period cramps.  Irregular menstrual cycle or bleeding.  Heavy menstrual flow.  Abnormal uterine bleeding.  Acne, depending on the type of pill.  Polycystic ovarian syndrome (POS).  Endometriosis.  Iron deficiency anemia.  Premenstrual symptoms, including severe irritability, depression, or anxiety. It also may:  Reduce the risk of endometrial and ovarian cancer.  Be used as emergency contraception.  Prevent ectopic pregnancies and infections of the fallopian tubes. What can make OCPs less effective? OCPs may be less effective if:  You forget to take the pill every day. For progestin-only pills, it is especially important to take the pill at the   same time each day. Even taking it 3 hours late can increase the risk of pregnancy.  You have a stomach or intestinal disease that reduces your body's ability to absorb the pill.  You take OCPs  with other medicines that make OCPs less effective, such as antibiotics, certain HIV medicines, and some seizure medicines.  You take expired OCPs.  You forget to restart the pill after 7 days of not taking it. This refers to the packs of 21 pills. What are the side effects and risks? OCPs can sometimes cause side effects, such as:  Headache.  Depression.  Trouble sleeping.  Nausea and vomiting.  Breast tenderness.  Irregular bleeding or spotting during the first several months.  Bloating or fluid retention.  Increase in blood pressure. Combination pills may slightly increase the risk of:  Blood clots.  Heart attack.  Stroke. Follow these instructions at home: Follow instructions from your health care provider about how to start taking your first cycle of OCPs. Depending on when you start the pill, you may need to use a backup form of birth control, such as condoms, during the first week. Make sure you know what steps to take if you forget to take the pill. Summary  Oral contraceptive pills (OCPs) are medicines taken by mouth to prevent pregnancy. They are highly effective when taken exactly as prescribed.  OCPs contain a combination of the hormones estrogen and progestin (synthetic progesterone) or progestin only.  Before you start taking the pill, you may have a physical exam, blood test, and Pap test. Your health care provider will make sure you are a good candidate for oral contraception.  The combination pill may come in a 21-day pack, a 28-day pack, or a 91-day pack. Progestin-only pills come in packs of 28 pills.  OCPs can sometimes cause side effects, such as headache, nausea, breast tenderness, or irregular bleeding. This information is not intended to replace advice given to you by your health care provider. Make sure you discuss any questions you have with your health care provider. Document Revised: 07/12/2020 Document Reviewed: 06/20/2020 Elsevier Patient  Education  2021 [employer]  

## 2021-01-08 NOTE — Progress Notes (Signed)
Patient ID: Monica Garrett, female   DOB: 02/23/1991, 30 y.o.   MRN: [id number]  Chief Complaint  Patient presents with  . NEW GYN/ANNUAL    HPI Pelvic pain and dyspareunia  Past Medical History:  Diagnosis Date  . Allergy   . Anxiety   . Hx of varicella   . No pertinent past medical history   . Vaginal Pap smear, abnormal   . Yeast infection of the vagina     Past Surgical History:  Procedure Laterality Date  . WISDOM TOOTH EXTRACTION      Family History  Problem Relation Age of Onset  . Hypertension Mother   . Hypertension Father   . Cancer Maternal Grandmother        ovarian  . Cancer Paternal Grandfather        pancreatic  . Other Neg Hx     Social History Social History   Tobacco Use  . Smoking status: Never Smoker  . Smokeless tobacco: Never Used  Vaping Use  . Vaping Use: Never used  Substance Use Topics  . Alcohol use: Yes    Comment: rarely  . Drug use: No    No Known Allergies  Current Outpatient Medications  Medication Sig Dispense Refill  . amoxicillin (AMOXIL) 875 MG tablet Take 1 tablet (875 mg total) by mouth 2 (two) times daily. (Patient not taking: Reported on 12/25/2018) 20 tablet 0  . benzonatate (TESSALON PERLES) 100 MG capsule Take 1 capsule (100 mg total) by mouth 3 (three) times daily as needed. (Patient not taking: Reported on 01/08/2021) 20 capsule 0  . cephALEXin (KEFLEX) 500 MG capsule Take 1 capsule (500 mg total) by mouth 2 (two) times daily. (Patient not taking: Reported on 09/29/2016) 14 capsule 0  . HYDROcodone-acetaminophen (NORCO) 5-325 MG tablet Take 1 tablet by mouth every 6 (six) hours as needed for severe pain. (Patient not taking: Reported on 01/08/2021) 10 tablet 0  . ibuprofen (ADVIL,MOTRIN) 200 MG tablet Take 400 mg by mouth every 6 (six) hours as needed for moderate pain. (Patient not taking: Reported on 01/08/2021)    . naproxen (NAPROSYN) 500 MG tablet Take 1 tablet (500 mg total) by mouth 2 (two) times daily as needed  for mild pain, moderate pain or headache (TAKE WITH MEALS.). (Patient not taking: Reported on 01/08/2021) 20 tablet 0  . ondansetron (ZOFRAN ODT) 4 MG disintegrating tablet Take 1 tablet (4 mg total) by mouth every 8 (eight) hours as needed for nausea or vomiting. (Patient not taking: Reported on 01/08/2021) 15 tablet 0  . oxyCODONE-acetaminophen (PERCOCET/ROXICET) 5-325 MG tablet Take 1 tablet by mouth every 4 (four) hours as needed (for pain scale 4-7). (Patient not taking: Reported on 09/29/2016) 30 tablet 0  . polyethylene glycol (MIRALAX / GLYCOLAX) packet Take 17 g by mouth daily. Take daily until you achieve soft daily bowel movements, then titrate dosing to continue having daily soft bowel movements (Patient not taking: Reported on 01/08/2021) 30 each 0  . promethazine (PHENERGAN) 25 MG tablet Take 1 tablet (25 mg total) by mouth every 6 (six) hours as needed for nausea or vomiting. 10 tablet 0  . triamcinolone ointment (KENALOG) 0.5 % Apply 1 application topically 2 (two) times daily. (Patient not taking: Reported on 09/29/2016) 30 g 0   No current facility-administered medications for this visit.    Review of Systems Review of Systems  Constitutional: Negative.   Gastrointestinal: Negative.   Genitourinary: Positive for dyspareunia and pelvic pain. Negative for dysuria,  vaginal bleeding and vaginal discharge.       Right breast tenderness and palpable mass at nipple    Blood pressure 120/73, pulse 71, height 5\' 9"  (1.753 m), weight 196 lb (88.9 kg), unknown if currently breastfeeding.  Physical Exam Physical Exam Nursing note reviewed. Exam conducted with a chaperone present.  Constitutional:      Appearance: Normal appearance. She is not ill-appearing.  HENT:     Head: Normocephalic.  Cardiovascular:     Rate and Rhythm: Normal rate.  Pulmonary:     Effort: Pulmonary effort is normal.     Breath sounds: Normal breath sounds.  Chest:  Breasts:     Right: Mass (2 cm mild  tenderness above nipple c/w fibrocystic changes) and tenderness present. No nipple discharge, axillary adenopathy or supraclavicular adenopathy.     Left: Normal. No axillary adenopathy or supraclavicular adenopathy.    Abdominal:     General: Abdomen is flat.     Palpations: Abdomen is soft.  Genitourinary:    General: Normal vulva.     Exam position: Lithotomy position.     Cervix: Normal.     Uterus: Normal.      Adnexa: Right adnexa normal and left adnexa normal.  Musculoskeletal:     Cervical back: Normal range of motion.  Lymphadenopathy:     Upper Body:     Right upper body: No supraclavicular, axillary or pectoral adenopathy.     Left upper body: No supraclavicular, axillary or pectoral adenopathy.  Skin:    General: Skin is warm and dry.  Neurological:     Mental Status: She is alert.  Psychiatric:        Mood and Affect: Mood normal.        Behavior: Behavior normal.         IUD Removal  Patient identified, informed consent performed, consent signed.  Patient was in the dorsal lithotomy position, normal external genitalia was noted.  A speculum was placed in the patient's vagina, normal discharge was noted, no lesions. The cervix was visualized, no lesions, no abnormal discharge.  The strings of the IUD were grasped and pulled using ring forceps. The IUD was removed in its entirety.   Patient tolerated the procedure well.    Patient will use OCP for contraception/.  Routine preventative health maintenance measures emphasized.     Data Reviewed Pap results  Assessment Well woman exam with routine gynecological exam - Plan: Cytology - PAP( [facility]), norgestimate-ethinyl estradiol (ORTHO-CYCLEN) 0.25-35 MG-MCG tablet  Breast pain, right - Plan: CANCELED: MM Digital Diagnostic Bilat, CANCELED: MM DIAG BREAST TOMO BILATERAL  Oral contraception initial prescription    Plan Meds ordered this encounter  Medications  . norgestimate-ethinyl estradiol  (ORTHO-CYCLEN) 0.25-35 MG-MCG tablet    Sig: Take 1 tablet by mouth daily.    Dispense:  28 tablet    Refill:  12       07-12-1999 01/08/2021, 2:01 PM

## 2021-01-09 LAB — CYTOLOGY - PAP
Comment: NEGATIVE
Diagnosis: NEGATIVE
High risk HPV: NEGATIVE

## 2021-01-24 DIAGNOSIS — Z419 Encounter for procedure for purposes other than remedying health state, unspecified: Secondary | ICD-10-CM | POA: Diagnosis not present

## 2021-02-21 ENCOUNTER — Other Ambulatory Visit: Payer: Self-pay

## 2021-02-21 ENCOUNTER — Ambulatory Visit
Admission: RE | Admit: 2021-02-21 | Discharge: 2021-02-21 | Disposition: A | Discharge status: Home / Self Care | Payer: Medicaid Other | Source: Ambulatory Visit | Attending: Obstetrics & Gynecology | Admitting: Obstetrics & Gynecology

## 2021-02-21 DIAGNOSIS — N6310 Unspecified lump in the right breast, unspecified quadrant: Secondary | ICD-10-CM

## 2021-02-21 DIAGNOSIS — N644 Mastodynia: Secondary | ICD-10-CM

## 2021-02-23 DIAGNOSIS — Z419 Encounter for procedure for purposes other than remedying health state, unspecified: Secondary | ICD-10-CM | POA: Diagnosis not present

## 2021-03-26 DIAGNOSIS — Z419 Encounter for procedure for purposes other than remedying health state, unspecified: Secondary | ICD-10-CM | POA: Diagnosis not present

## 2021-04-25 DIAGNOSIS — Z419 Encounter for procedure for purposes other than remedying health state, unspecified: Secondary | ICD-10-CM | POA: Diagnosis not present

## 2021-05-26 DIAGNOSIS — Z419 Encounter for procedure for purposes other than remedying health state, unspecified: Secondary | ICD-10-CM | POA: Diagnosis not present

## 2021-06-26 DIAGNOSIS — Z419 Encounter for procedure for purposes other than remedying health state, unspecified: Secondary | ICD-10-CM | POA: Diagnosis not present

## 2021-07-26 DIAGNOSIS — Z419 Encounter for procedure for purposes other than remedying health state, unspecified: Secondary | ICD-10-CM | POA: Diagnosis not present

## 2021-08-26 DIAGNOSIS — Z419 Encounter for procedure for purposes other than remedying health state, unspecified: Secondary | ICD-10-CM | POA: Diagnosis not present

## 2021-09-14 IMAGING — MG DIGITAL DIAGNOSTIC BILAT W/ TOMO W/ CAD
6 of 10 series · 6 of 30 positions shown · non-contrast
Comparison: None.

CLINICAL DATA: 29-year-old female presenting with a new lump and
pain in the right breast.

EXAM:
DIGITAL DIAGNOSTIC BILATERAL MAMMOGRAM WITH TOMOSYNTHESIS AND CAD;
ULTRASOUND RIGHT BREAST LIMITED
TECHNIQUE: Bilateral digital diagnostic mammography and breast tomosynthesis
was performed. The images were evaluated with computer-aided
detection.; Targeted ultrasound examination of the right breast was
performed

[R MLO synth-2D]
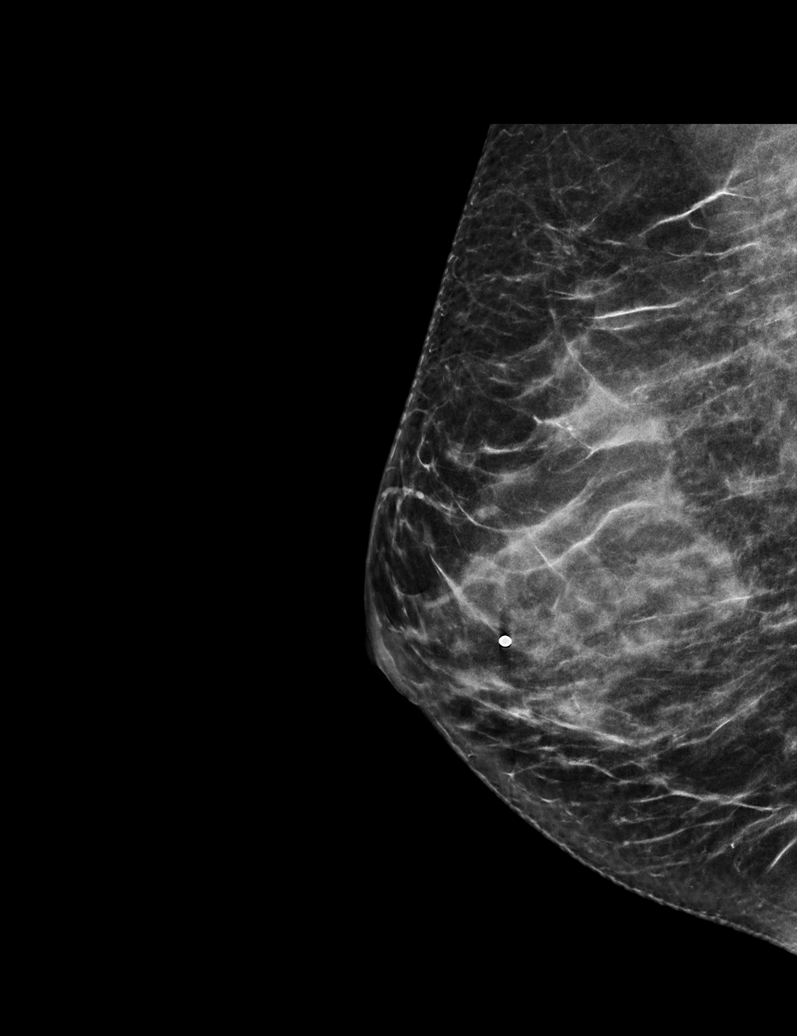

[R CC synth-2D]
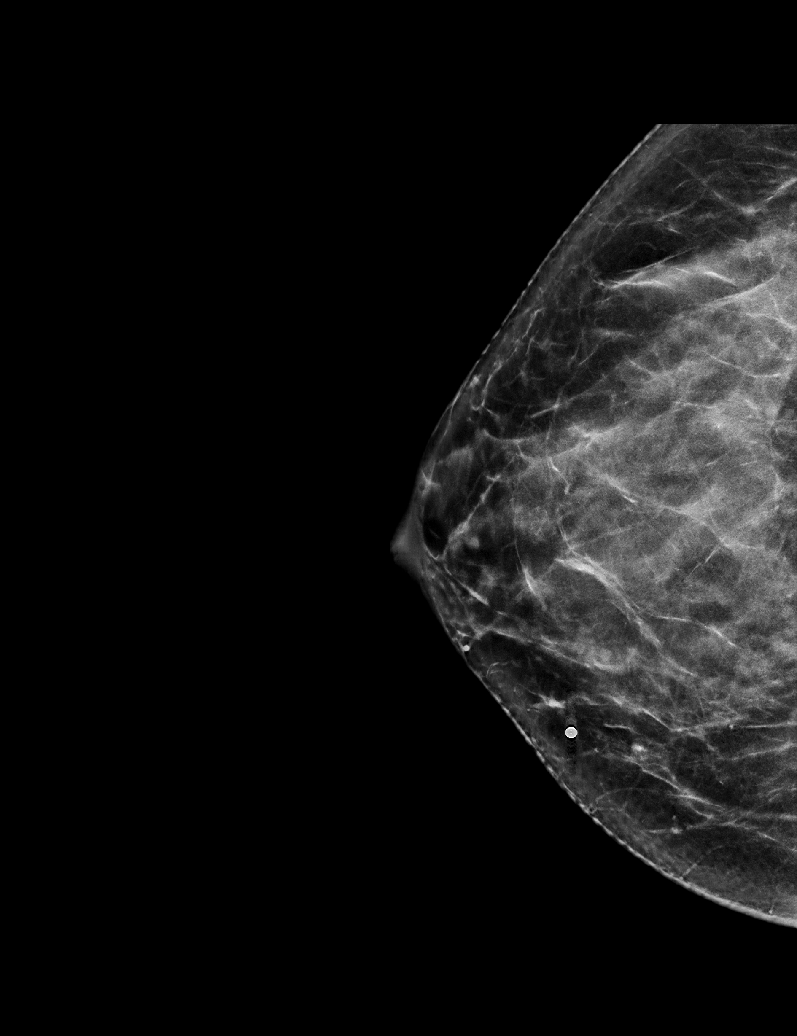

[L MLO synth-2D]
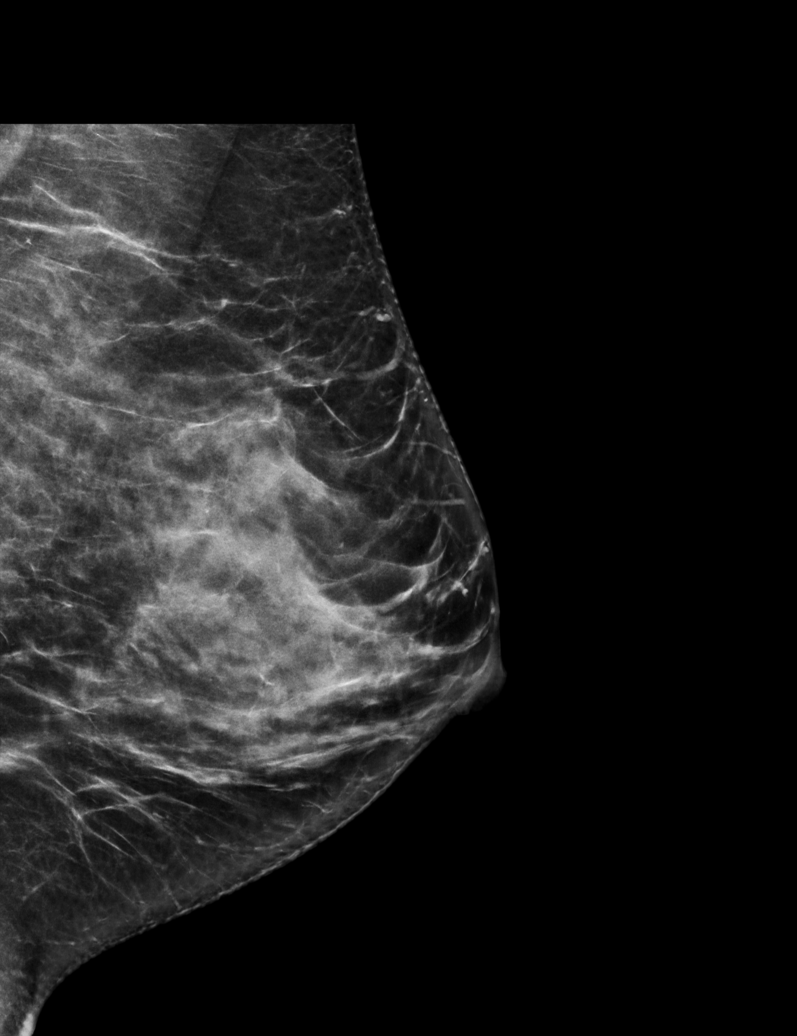

[L CC synth-2D]
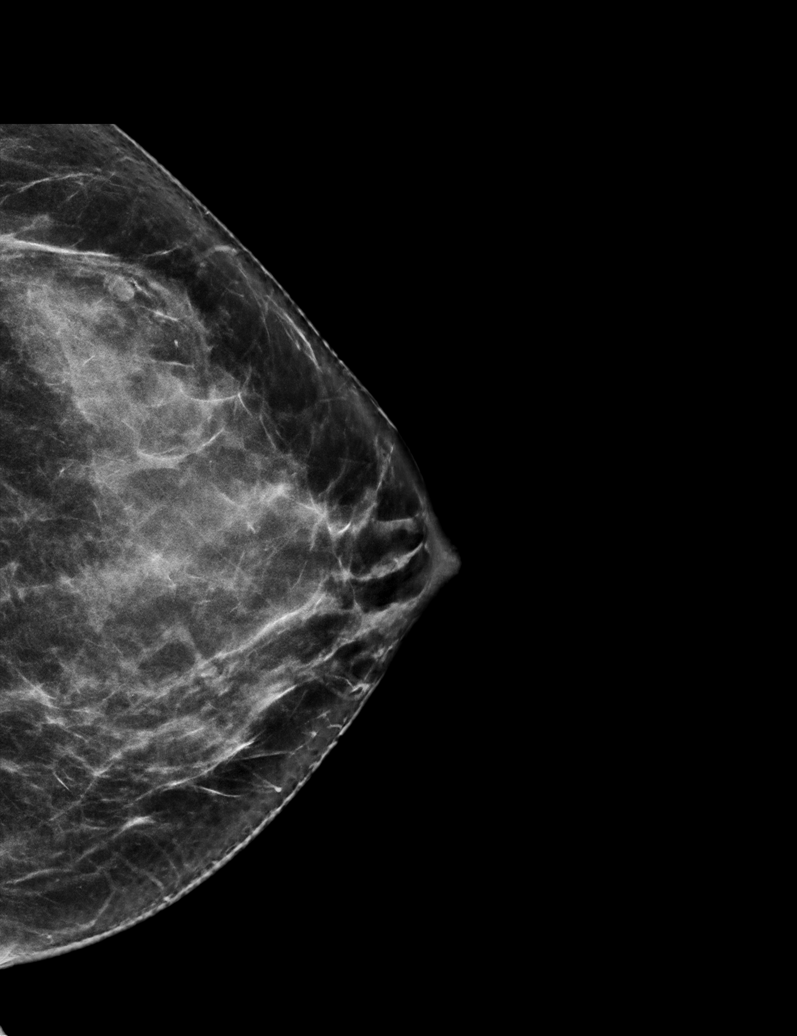

[R TAN synth-2D]
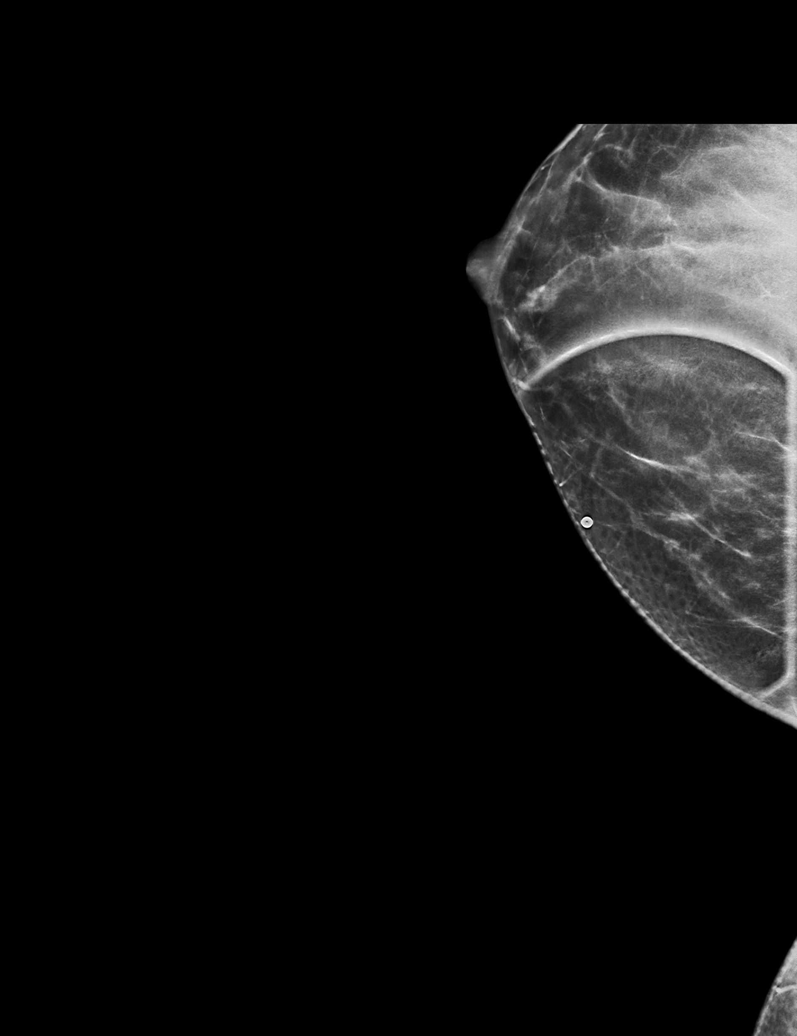

[R MLO tomo · tomo slice 33/64.0]
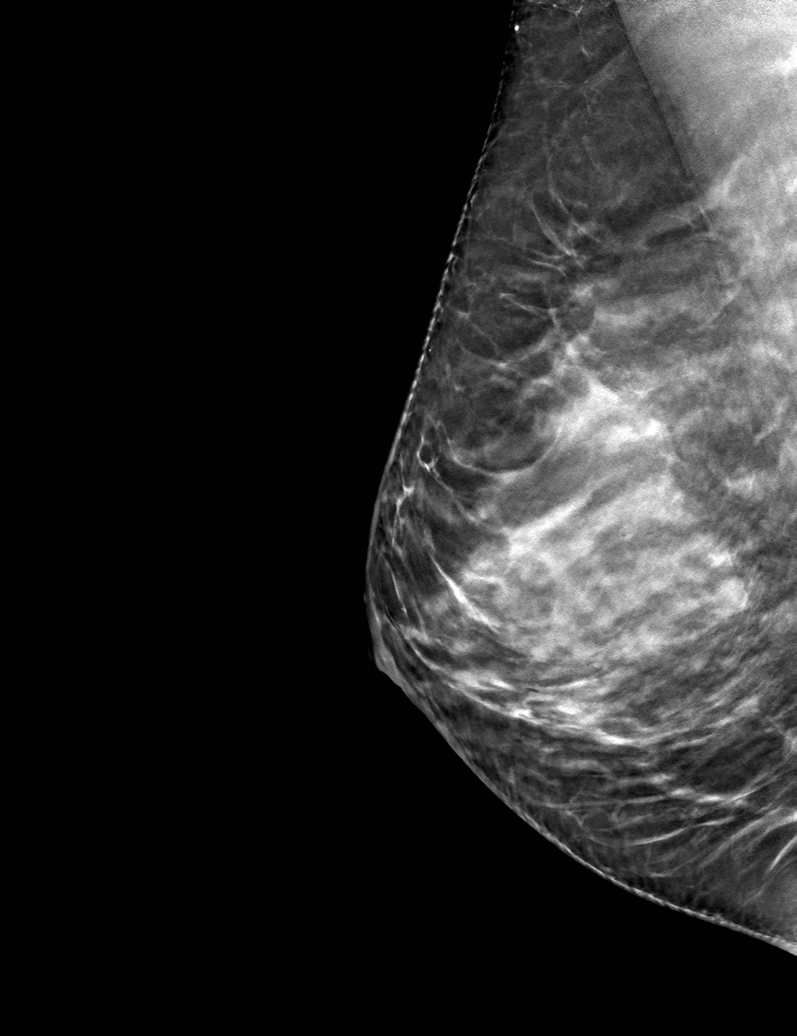

[6 of 30 positions shown; findings below may reference images not displayed]

ACR Breast Density Category d: The breast tissue is extremely dense,
which lowers the sensitivity of mammography.
FINDINGS: Mammogram:

Right breast: A skin BB marks the palpable site of concern reported
by the patient in the upper inner right breast. A spot tangential
view of this area was performed in addition to standard views. There
is no definite abnormality at the palpable site or elsewhere in the
right breast.

Left breast: No suspicious mass, distortion, or microcalcifications
are identified to suggest presence of malignancy.

On physical exam at the site of concern reported by the patient I
feel a ridge of tissue without a fixed discrete mass.

Ultrasound:

Targeted ultrasound performed at the site of concern reported by the
patient in the right breast at 2:30 o'clock 1 cm from the nipple
demonstrating an oval circumscribed anechoic mass measuring 0.8 x
0.3 x 0.5 cm, consistent with a benign simple cyst. No suspicious
solid mass.
IMPRESSION: 1. At the site of concern in the right breast there is a benign
subcentimeter simple cyst. No mammographic or sonographic evidence
of malignancy.

2.  No mammographic evidence of malignancy in the left breast.

RECOMMENDATION:
1.  Continued clinical surveillance.

2.  Begin routine annual screening mammography at age 38.

I have discussed the findings and recommendations with the patient.
If applicable, a reminder letter will be sent to the patient
regarding the next appointment.

BI-RADS CATEGORY  2: Benign.

## 2021-09-14 IMAGING — US US BREAST*R* LIMITED INC AXILLA
1 series · 8 of 8 positions shown · non-contrast
Comparison: None.

CLINICAL DATA: 29-year-old female presenting with a new lump and
pain in the right breast.

EXAM:
DIGITAL DIAGNOSTIC BILATERAL MAMMOGRAM WITH TOMOSYNTHESIS AND CAD;
ULTRASOUND RIGHT BREAST LIMITED
TECHNIQUE: Bilateral digital diagnostic mammography and breast tomosynthesis
was performed. The images were evaluated with computer-aided
detection.; Targeted ultrasound examination of the right breast was
performed

[Series 1: us breast*right* limited inc axilla · 0.07mm/px · 8 of 8 slices shown]
[im 1/8]
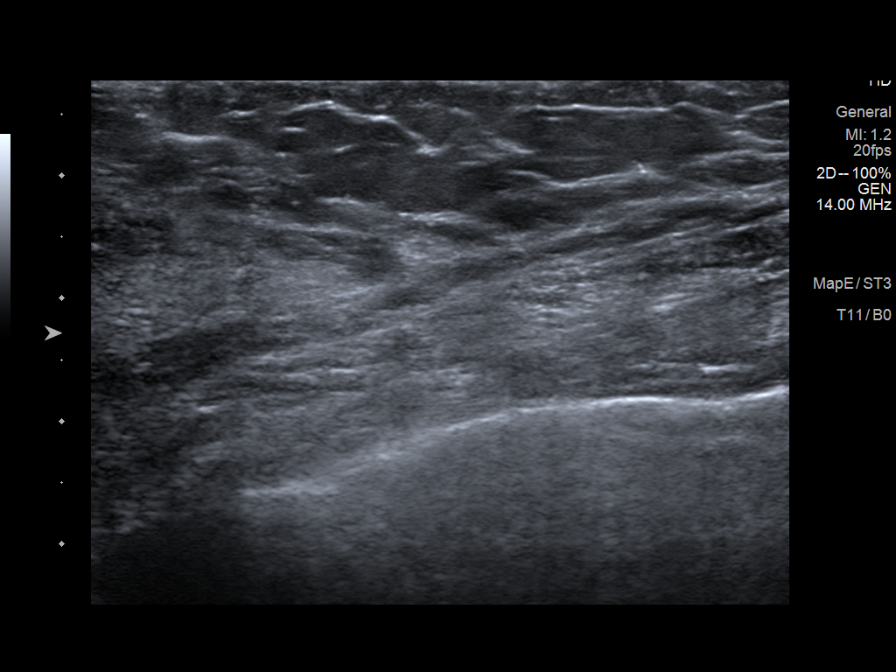
[im 2/8]
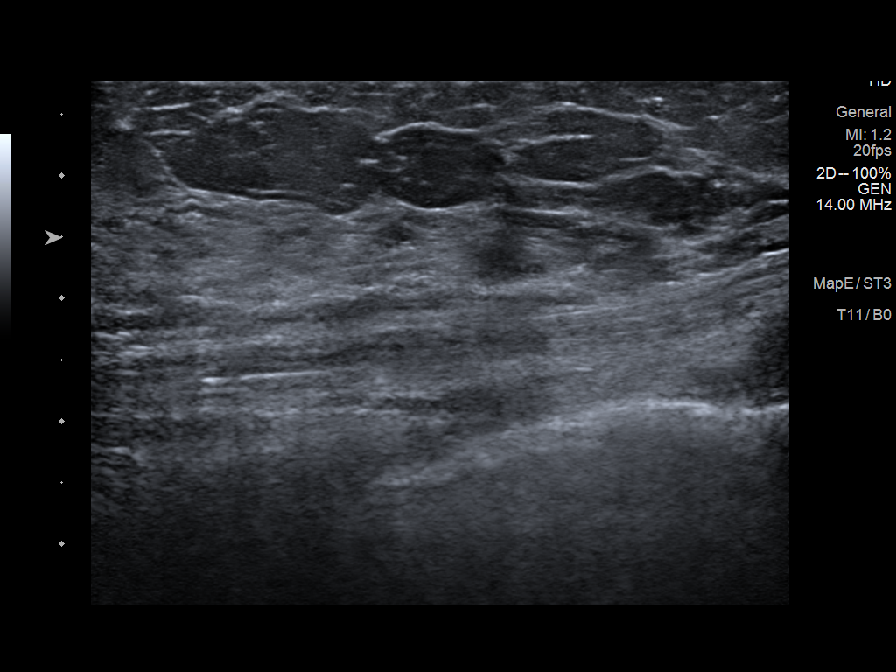
[im 3/8]
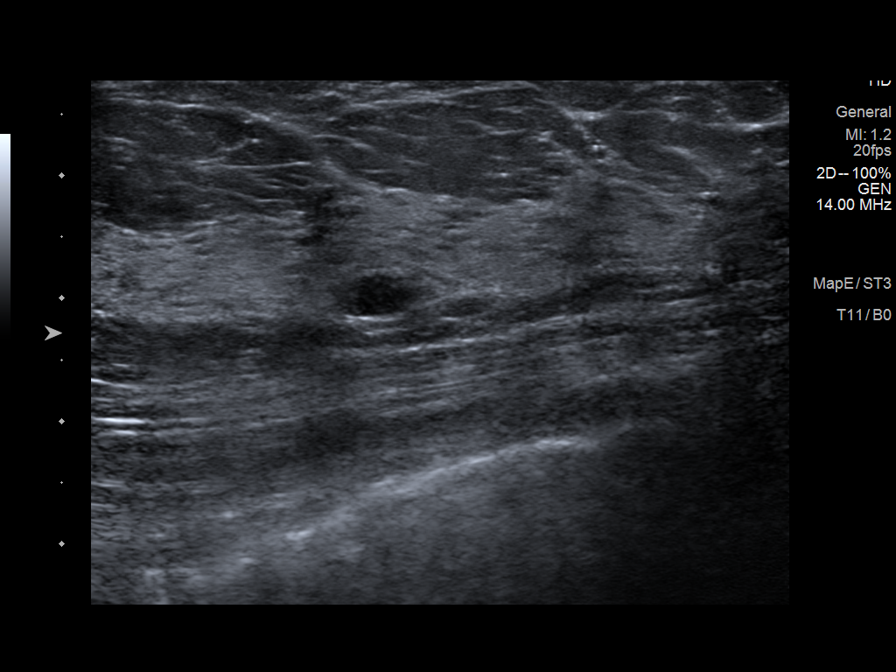
[im 4/8]
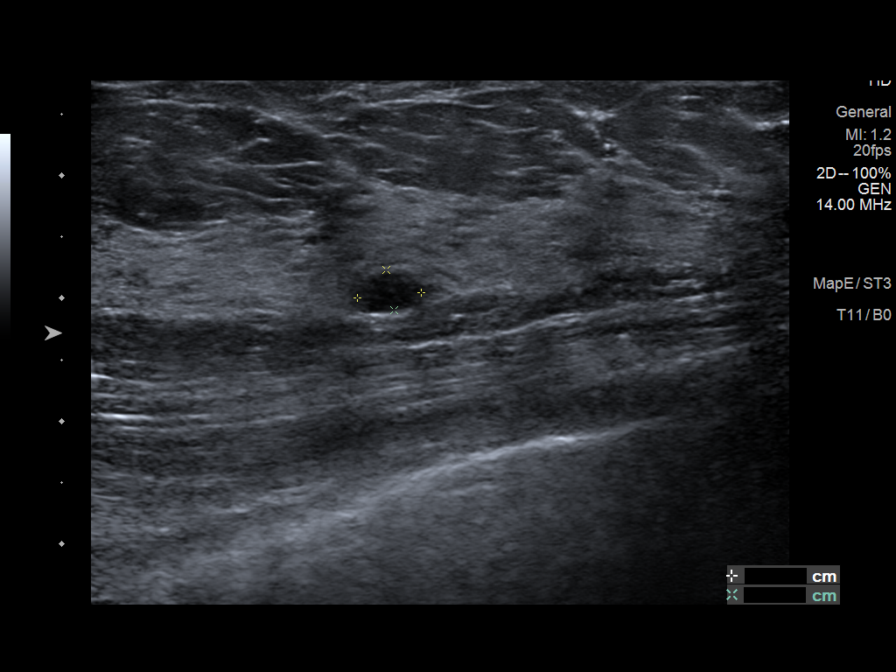
[im 5/8]
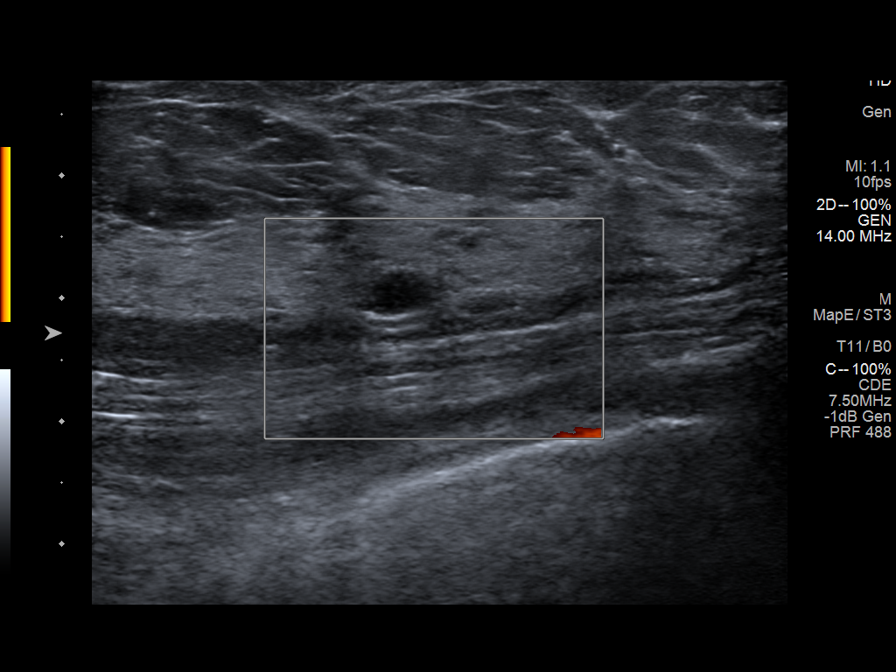
[im 6/8]
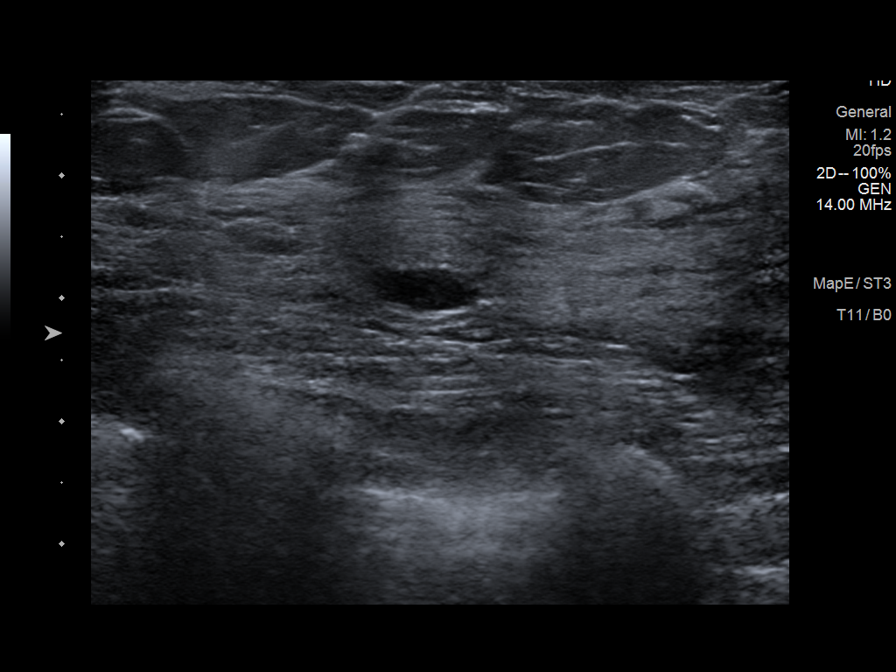
[im 7/8]
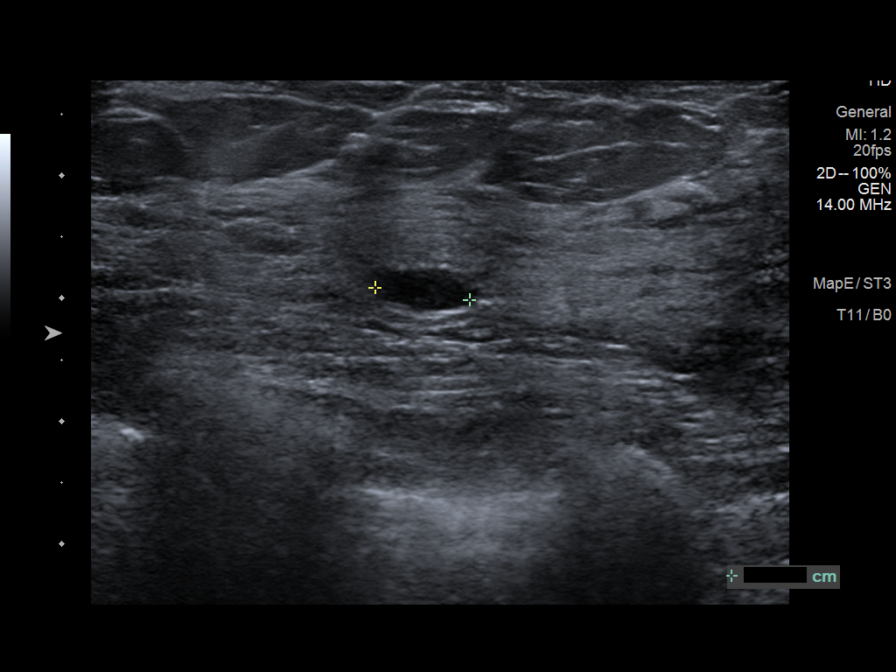
[im 8/8]
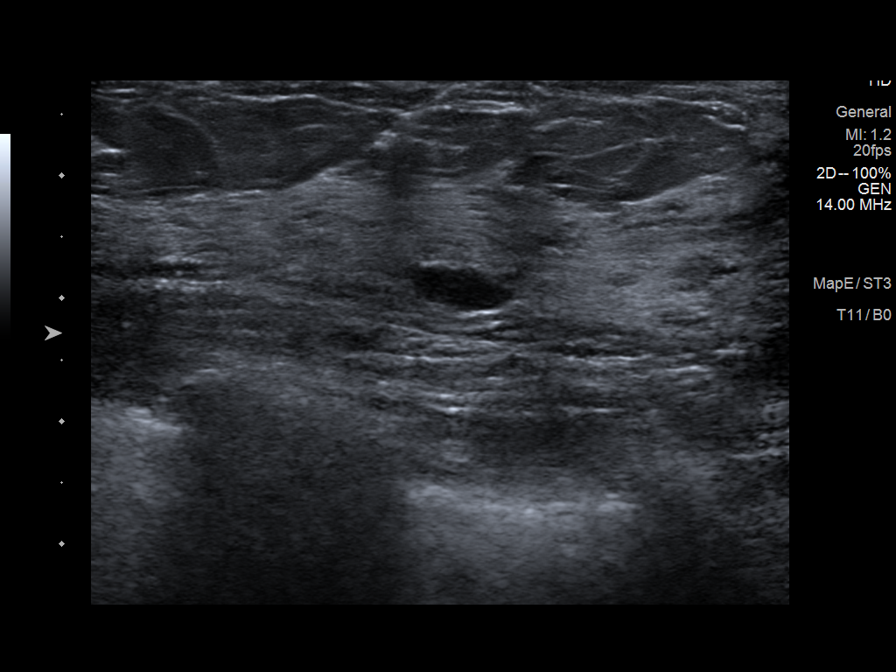

[8 of 8 positions shown; findings below may reference images not displayed]

ACR Breast Density Category d: The breast tissue is extremely dense,
which lowers the sensitivity of mammography.
FINDINGS: Mammogram:

Right breast: A skin BB marks the palpable site of concern reported
by the patient in the upper inner right breast. A spot tangential
view of this area was performed in addition to standard views. There
is no definite abnormality at the palpable site or elsewhere in the
right breast.

Left breast: No suspicious mass, distortion, or microcalcifications
are identified to suggest presence of malignancy.

On physical exam at the site of concern reported by the patient I
feel a ridge of tissue without a fixed discrete mass.

Ultrasound:

Targeted ultrasound performed at the site of concern reported by the
patient in the right breast at 2:30 o'clock 1 cm from the nipple
demonstrating an oval circumscribed anechoic mass measuring 0.8 x
0.3 x 0.5 cm, consistent with a benign simple cyst. No suspicious
solid mass.
IMPRESSION: 1. At the site of concern in the right breast there is a benign
subcentimeter simple cyst. No mammographic or sonographic evidence
of malignancy.

2.  No mammographic evidence of malignancy in the left breast.

RECOMMENDATION:
1.  Continued clinical surveillance.

2.  Begin routine annual screening mammography at age 38.

I have discussed the findings and recommendations with the patient.
If applicable, a reminder letter will be sent to the patient
regarding the next appointment.

BI-RADS CATEGORY  2: Benign.

## 2021-09-25 DIAGNOSIS — Z419 Encounter for procedure for purposes other than remedying health state, unspecified: Secondary | ICD-10-CM | POA: Diagnosis not present

## 2021-10-26 DIAGNOSIS — Z419 Encounter for procedure for purposes other than remedying health state, unspecified: Secondary | ICD-10-CM | POA: Diagnosis not present

## 2021-11-26 DIAGNOSIS — Z419 Encounter for procedure for purposes other than remedying health state, unspecified: Secondary | ICD-10-CM | POA: Diagnosis not present

## 2021-12-24 DIAGNOSIS — Z419 Encounter for procedure for purposes other than remedying health state, unspecified: Secondary | ICD-10-CM | POA: Diagnosis not present

## 2022-01-24 DIAGNOSIS — Z419 Encounter for procedure for purposes other than remedying health state, unspecified: Secondary | ICD-10-CM | POA: Diagnosis not present

## 2022-02-07 DIAGNOSIS — Z0000 Encounter for general adult medical examination without abnormal findings: Secondary | ICD-10-CM | POA: Diagnosis not present

## 2022-02-07 DIAGNOSIS — R635 Abnormal weight gain: Secondary | ICD-10-CM | POA: Diagnosis not present

## 2022-02-07 DIAGNOSIS — Z79899 Other long term (current) drug therapy: Secondary | ICD-10-CM | POA: Diagnosis not present

## 2022-02-07 DIAGNOSIS — Z1159 Encounter for screening for other viral diseases: Secondary | ICD-10-CM | POA: Diagnosis not present

## 2022-02-07 DIAGNOSIS — Z6829 Body mass index (BMI) 29.0-29.9, adult: Secondary | ICD-10-CM | POA: Diagnosis not present

## 2022-02-07 DIAGNOSIS — Z0130 Encounter for examination of blood pressure without abnormal findings: Secondary | ICD-10-CM | POA: Diagnosis not present

## 2022-02-23 DIAGNOSIS — Z419 Encounter for procedure for purposes other than remedying health state, unspecified: Secondary | ICD-10-CM | POA: Diagnosis not present

## 2022-02-25 DIAGNOSIS — R635 Abnormal weight gain: Secondary | ICD-10-CM | POA: Diagnosis not present

## 2022-02-25 DIAGNOSIS — Z79899 Other long term (current) drug therapy: Secondary | ICD-10-CM | POA: Diagnosis not present

## 2022-02-25 DIAGNOSIS — Z0130 Encounter for examination of blood pressure without abnormal findings: Secondary | ICD-10-CM | POA: Diagnosis not present

## 2022-02-25 DIAGNOSIS — Z6828 Body mass index (BMI) 28.0-28.9, adult: Secondary | ICD-10-CM | POA: Diagnosis not present

## 2022-02-25 DIAGNOSIS — R634 Abnormal weight loss: Secondary | ICD-10-CM | POA: Diagnosis not present

## 2022-03-26 DIAGNOSIS — Z419 Encounter for procedure for purposes other than remedying health state, unspecified: Secondary | ICD-10-CM | POA: Diagnosis not present

## 2022-04-06 DIAGNOSIS — Z79899 Other long term (current) drug therapy: Secondary | ICD-10-CM | POA: Diagnosis not present

## 2022-04-06 DIAGNOSIS — Z6828 Body mass index (BMI) 28.0-28.9, adult: Secondary | ICD-10-CM | POA: Diagnosis not present

## 2022-04-06 DIAGNOSIS — E663 Overweight: Secondary | ICD-10-CM | POA: Diagnosis not present

## 2022-04-06 DIAGNOSIS — Z0130 Encounter for examination of blood pressure without abnormal findings: Secondary | ICD-10-CM | POA: Diagnosis not present

## 2022-04-25 DIAGNOSIS — Z419 Encounter for procedure for purposes other than remedying health state, unspecified: Secondary | ICD-10-CM | POA: Diagnosis not present

## 2022-05-13 DIAGNOSIS — Z789 Other specified health status: Secondary | ICD-10-CM | POA: Diagnosis not present

## 2022-05-13 DIAGNOSIS — E663 Overweight: Secondary | ICD-10-CM | POA: Diagnosis not present

## 2022-05-13 DIAGNOSIS — Z79899 Other long term (current) drug therapy: Secondary | ICD-10-CM | POA: Diagnosis not present

## 2022-05-13 DIAGNOSIS — Z0130 Encounter for examination of blood pressure without abnormal findings: Secondary | ICD-10-CM | POA: Diagnosis not present

## 2022-05-13 DIAGNOSIS — Z6827 Body mass index (BMI) 27.0-27.9, adult: Secondary | ICD-10-CM | POA: Diagnosis not present

## 2022-05-26 DIAGNOSIS — Z419 Encounter for procedure for purposes other than remedying health state, unspecified: Secondary | ICD-10-CM | POA: Diagnosis not present

## 2022-06-01 ENCOUNTER — Telehealth: Payer: Medicaid Other | Admitting: Physician Assistant

## 2022-06-01 DIAGNOSIS — J111 Influenza due to unidentified influenza virus with other respiratory manifestations: Secondary | ICD-10-CM | POA: Diagnosis not present

## 2022-06-01 MED ORDER — OSELTAMIVIR PHOSPHATE 75 MG PO CAPS: 75.0000 mg | ORAL_CAPSULE | Freq: Two times a day (BID) | ORAL | 0 refills | Status: DC

## 2022-06-01 NOTE — Progress Notes (Signed)
Virtual Visit Consent   Monica Garrett, you are scheduled for a virtual visit with a [facility] provider today. Just as with appointments in the office, your consent must be obtained to participate. Your consent will be active for this visit and any virtual visit you may have with one of our providers in the next 365 days. If you have a MyChart account, a copy of this consent can be sent to you electronically.  As this is a virtual visit, video technology does not allow for your provider to perform a traditional examination. This may limit your provider's ability to fully assess your condition. If your provider identifies any concerns that need to be evaluated in person or the need to arrange testing (such as labs, EKG, etc.), we will make arrangements to do so. Although advances in technology are sophisticated, we cannot ensure that it will always work on either your end or our end. If the connection with a video visit is poor, the visit may have to be switched to a telephone visit. With either a video or telephone visit, we are not always able to ensure that we have a secure connection.  By engaging in this virtual visit, you consent to the provision of healthcare and authorize for your insurance to be billed (if applicable) for the services provided during this visit. Depending on your insurance coverage, you may receive a charge related to this service.  I need to obtain your verbal consent now. Are you willing to proceed with your visit today? Monica Garrett has provided verbal consent on 06/01/2022 for a virtual visit (video or telephone). [name], PA-C  Date: 06/01/2022 1:04 PM  Virtual Visit via Video Note   I, [name], connected with  Monica Garrett  ([id number], March 31, 1991) on 06/01/22 at  1:00 PM EDT by a video-enabled telemedicine application and verified that I am speaking with the correct person using two identifiers.  Location: Patient: Virtual Visit  Location Patient: Home Provider: Virtual Visit Location Provider: Home Office   I discussed the limitations of evaluation and management by telemedicine and the availability of in person appointments. The patient expressed understanding and agreed to proceed.    History of Present Illness: Monica Garrett is a 31 y.o. who identifies as a female who was assigned female at birth, and is being seen today for possible influenza.  HPI: Influenza This is a new problem. The current episode started yesterday (kids were diagnosed with influenza last week and she started with symptoms yesterday). The problem occurs constantly. The problem has been gradually worsening. Associated symptoms include chills, congestion, coughing, fatigue, a fever (100.3), headaches, myalgias and a sore throat. Pertinent negatives include no nausea or vomiting. Nothing aggravates the symptoms. She has tried acetaminophen, rest and NSAIDs for the symptoms. The treatment provided no relief.      Problems: There are no problems to display for this patient.   Allergies: No Known Allergies Medications:  Current Outpatient Medications:    oseltamivir (TAMIFLU) 75 MG capsule, Take 1 capsule (75 mg total) by mouth 2 (two) times daily., Disp: 10 capsule, Rfl: 0   amoxicillin (AMOXIL) 875 MG tablet, Take 1 tablet (875 mg total) by mouth 2 (two) times daily. (Patient not taking: Reported on 12/25/2018), Disp: 20 tablet, Rfl: 0   benzonatate (TESSALON PERLES) 100 MG capsule, Take 1 capsule (100 mg total) by mouth 3 (three) times daily as needed. (Patient not taking: Reported on 01/08/2021), Disp: 20 capsule,  Rfl: 0   cephALEXin (KEFLEX) 500 MG capsule, Take 1 capsule (500 mg total) by mouth 2 (two) times daily. (Patient not taking: Reported on 09/29/2016), Disp: 14 capsule, Rfl: 0   HYDROcodone-acetaminophen (NORCO) 5-325 MG tablet, Take 1 tablet by mouth every 6 (six) hours as needed for severe pain. (Patient not taking: Reported on  01/08/2021), Disp: 10 tablet, Rfl: 0   ibuprofen (ADVIL,MOTRIN) 200 MG tablet, Take 400 mg by mouth every 6 (six) hours as needed for moderate pain. (Patient not taking: Reported on 01/08/2021), Disp: , Rfl:    naproxen (NAPROSYN) 500 MG tablet, Take 1 tablet (500 mg total) by mouth 2 (two) times daily as needed for mild pain, moderate pain or headache (TAKE WITH MEALS.). (Patient not taking: Reported on 01/08/2021), Disp: 20 tablet, Rfl: 0   norgestimate-ethinyl estradiol (ORTHO-CYCLEN) 0.25-35 MG-MCG tablet, Take 1 tablet by mouth daily., Disp: 28 tablet, Rfl: 12   ondansetron (ZOFRAN ODT) 4 MG disintegrating tablet, Take 1 tablet (4 mg total) by mouth every 8 (eight) hours as needed for nausea or vomiting. (Patient not taking: Reported on 01/08/2021), Disp: 15 tablet, Rfl: 0   oxyCODONE-acetaminophen (PERCOCET/ROXICET) 5-325 MG tablet, Take 1 tablet by mouth every 4 (four) hours as needed (for pain scale 4-7). (Patient not taking: Reported on 09/29/2016), Disp: 30 tablet, Rfl: 0   polyethylene glycol (MIRALAX / GLYCOLAX) packet, Take 17 g by mouth daily. Take daily until you achieve soft daily bowel movements, then titrate dosing to continue having daily soft bowel movements (Patient not taking: Reported on 01/08/2021), Disp: 30 each, Rfl: 0   promethazine (PHENERGAN) 25 MG tablet, Take 1 tablet (25 mg total) by mouth every 6 (six) hours as needed for nausea or vomiting., Disp: 10 tablet, Rfl: 0   triamcinolone ointment (KENALOG) 0.5 %, Apply 1 application topically 2 (two) times daily. (Patient not taking: Reported on 09/29/2016), Disp: 30 g, Rfl: 0  Observations/Objective: Patient is well-developed, well-nourished in no acute distress.  Resting comfortably at home.  Head is normocephalic, atraumatic.  No labored breathing.  Speech is clear and coherent with logical content.  Patient is alert and oriented at baseline.    Assessment and Plan: 1. Influenza - oseltamivir (TAMIFLU) 75 MG capsule; Take  1 capsule (75 mg total) by mouth 2 (two) times daily.  Dispense: 10 capsule; Refill: 0  - Possible flu as children were positive with influenza last week - Limited testing availability that would delay appropriate treatment waiting on results - Tamiflu prescribed for possible flu - Push fluids - Symptomatic management OTC of choice as needed - Seek in person evaluation if symptoms worsen or fail to improve   Follow Up Instructions: I discussed the assessment and treatment plan with the patient. The patient was provided an opportunity to ask questions and all were answered. The patient agreed with the plan and demonstrated an understanding of the instructions.  A copy of instructions were sent to the patient via MyChart unless otherwise noted below.    The patient was advised to call back or seek an in-person evaluation if the symptoms worsen or if the condition fails to improve as anticipated.  Time:  I spent 10 minutes with the patient via telehealth technology discussing the above problems/concerns.    [name], PA-C

## 2022-06-01 NOTE — Patient Instructions (Signed)
Monica Garrett, thank you for joining [name], PA-C for today's virtual visit.  While this provider is not your primary care provider (PCP), if your PCP is located in our provider database this encounter information will be shared with them immediately following your visit.  Consent: (Patient) Monica Garrett provided verbal consent for this virtual visit at the beginning of the encounter.  Current Medications:  Current Outpatient Medications:    oseltamivir (TAMIFLU) 75 MG capsule, Take 1 capsule (75 mg total) by mouth 2 (two) times daily., Disp: 10 capsule, Rfl: 0   amoxicillin (AMOXIL) 875 MG tablet, Take 1 tablet (875 mg total) by mouth 2 (two) times daily. (Patient not taking: Reported on 12/25/2018), Disp: 20 tablet, Rfl: 0   benzonatate (TESSALON PERLES) 100 MG capsule, Take 1 capsule (100 mg total) by mouth 3 (three) times daily as needed. (Patient not taking: Reported on 01/08/2021), Disp: 20 capsule, Rfl: 0   cephALEXin (KEFLEX) 500 MG capsule, Take 1 capsule (500 mg total) by mouth 2 (two) times daily. (Patient not taking: Reported on 09/29/2016), Disp: 14 capsule, Rfl: 0   HYDROcodone-acetaminophen (NORCO) 5-325 MG tablet, Take 1 tablet by mouth every 6 (six) hours as needed for severe pain. (Patient not taking: Reported on 01/08/2021), Disp: 10 tablet, Rfl: 0   ibuprofen (ADVIL,MOTRIN) 200 MG tablet, Take 400 mg by mouth every 6 (six) hours as needed for moderate pain. (Patient not taking: Reported on 01/08/2021), Disp: , Rfl:    naproxen (NAPROSYN) 500 MG tablet, Take 1 tablet (500 mg total) by mouth 2 (two) times daily as needed for mild pain, moderate pain or headache (TAKE WITH MEALS.). (Patient not taking: Reported on 01/08/2021), Disp: 20 tablet, Rfl: 0   norgestimate-ethinyl estradiol (ORTHO-CYCLEN) 0.25-35 MG-MCG tablet, Take 1 tablet by mouth daily., Disp: 28 tablet, Rfl: 12   ondansetron (ZOFRAN ODT) 4 MG disintegrating tablet, Take 1 tablet (4 mg total) by mouth  every 8 (eight) hours as needed for nausea or vomiting. (Patient not taking: Reported on 01/08/2021), Disp: 15 tablet, Rfl: 0   oxyCODONE-acetaminophen (PERCOCET/ROXICET) 5-325 MG tablet, Take 1 tablet by mouth every 4 (four) hours as needed (for pain scale 4-7). (Patient not taking: Reported on 09/29/2016), Disp: 30 tablet, Rfl: 0   polyethylene glycol (MIRALAX / GLYCOLAX) packet, Take 17 g by mouth daily. Take daily until you achieve soft daily bowel movements, then titrate dosing to continue having daily soft bowel movements (Patient not taking: Reported on 01/08/2021), Disp: 30 each, Rfl: 0   promethazine (PHENERGAN) 25 MG tablet, Take 1 tablet (25 mg total) by mouth every 6 (six) hours as needed for nausea or vomiting., Disp: 10 tablet, Rfl: 0   triamcinolone ointment (KENALOG) 0.5 %, Apply 1 application topically 2 (two) times daily. (Patient not taking: Reported on 09/29/2016), Disp: 30 g, Rfl: 0   Medications ordered in this encounter:  Meds ordered this encounter  Medications   oseltamivir (TAMIFLU) 75 MG capsule    Sig: Take 1 capsule (75 mg total) by mouth 2 (two) times daily.    Dispense:  10 capsule    Refill:  0    Order Specific Question:   Supervising Provider    Answer:   Hyacinth Meeker, BRIAN [[id number]]     *If you need refills on other medications prior to your next appointment, please contact your pharmacy*  Follow-Up: Call back or seek an in-person evaluation if the symptoms worsen or if the condition fails to improve as anticipated.  Other  Instructions Influenza, Adult Influenza, also called "the flu," is a viral infection that mainly affects the respiratory tract. This includes the lungs, nose, and throat. The flu spreads easily from person to person (is contagious). It causes common cold symptoms, along with high fever and body aches. What are the causes? This condition is caused by the influenza virus. You can get the virus by: Breathing in droplets that are in the air from an  infected person's cough or sneeze. Touching something that has the virus on it (has been contaminated) and then touching your mouth, nose, or eyes. What increases the risk? The following factors may make you more likely to get the flu: Not washing or sanitizing your hands often. Having close contact with many people during cold and flu season. Touching your mouth, eyes, or nose without first washing or sanitizing your hands. Not getting an annual flu shot. You may have a higher risk for the flu, including serious problems, such as a lung infection (pneumonia), if you: Are older than 65. Are pregnant. Have a weakened disease-fighting system (immune system). This includes people who have HIV or AIDS, are on chemotherapy, or are taking medicines that reduce (suppress) the immune system. Have a long-term (chronic) illness, such as heart disease, kidney disease, diabetes, or lung disease. Have a liver disorder. Are severely overweight (morbidly obese). Have anemia. Have asthma. What are the signs or symptoms? Symptoms of this condition usually begin suddenly and last 4-14 days. These may include: Fever and chills. Headaches, body aches, or muscle aches. Sore throat. Cough. Runny or stuffy (congested) nose. Chest discomfort. Poor appetite. Weakness or fatigue. Dizziness. Nausea or vomiting. How is this diagnosed? This condition may be diagnosed based on: Your symptoms and medical history. A physical exam. Swabbing your nose or throat and testing the fluid for the influenza virus. How is this treated? If the flu is diagnosed early, you can be treated with antiviral medicine that is given by mouth (orally) or through an IV. This can help reduce how severe the illness is and how long it lasts. Taking care of yourself at home can help relieve symptoms. Your health care provider may recommend: Taking over-the-counter medicines. Drinking plenty of fluids. In many cases, the flu goes away  on its own. If you have severe symptoms or complications, you may be treated in a hospital. Follow these instructions at home: Activity Rest as needed and get plenty of sleep. Stay home from work or school as told by your health care provider. Unless you are visiting your health care provider, avoid leaving home until your fever has been gone for 24 hours without taking medicine. Eating and drinking Take an oral rehydration solution (ORS). This is a drink that is sold at pharmacies and retail stores. Drink enough fluid to keep your urine pale yellow. Drink clear fluids in small amounts as you are able. Clear fluids include water, ice chips, fruit juice mixed with water, and low-calorie sports drinks. Eat bland, easy-to-digest foods in small amounts as you are able. These foods include bananas, applesauce, rice, lean meats, toast, and crackers. Avoid drinking fluids that contain a lot of sugar or caffeine, such as energy drinks, regular sports drinks, and soda. Avoid alcohol. Avoid spicy or fatty foods. General instructions     Take over-the-counter and prescription medicines only as told by your health care provider. Use a cool mist humidifier to add humidity to the air in your home. This can make it easier to breathe. When using  a cool mist humidifier, clean it daily. Empty the water and replace it with clean water. Cover your mouth and nose when you cough or sneeze. Wash your hands with soap and water often and for at least 20 seconds, especially after you cough or sneeze. If soap and water are not available, use alcohol-based hand sanitizer. Keep all follow-up visits. This is important. How is this prevented?  Get an annual flu shot. This is usually available in late summer, fall, or winter. Ask your health care provider when you should get your flu shot. Avoid contact with people who are sick during cold and flu season. This is generally fall and winter. Contact a health care provider  if: You develop new symptoms. You have: Chest pain. Diarrhea. A fever. Your cough gets worse. You produce more mucus. You feel nauseous or you vomit. Get help right away if you: Develop shortness of breath or have difficulty breathing. Have skin or nails that turn a bluish color. Have severe pain or stiffness in your neck. Develop a sudden headache or sudden pain in your face or ear. Cannot eat or drink without vomiting. These symptoms may represent a serious problem that is an emergency. Do not wait to see if the symptoms will go away. Get medical help right away. Call your local emergency services (911 in the [place] Do not drive yourself to the hospital. Summary Influenza, also called "the flu," is a viral infection that primarily affects your respiratory tract. Symptoms of the flu usually begin suddenly and last 4-14 days. Getting an annual flu shot is the best way to prevent getting the flu. Stay home from work or school as told by your health care provider. Unless you are visiting your health care provider, avoid leaving home until your fever has been gone for 24 hours without taking medicine. Keep all follow-up visits. This is important. This information is not intended to replace advice given to you by your health care provider. Make sure you discuss any questions you have with your health care provider. Document Revised: 05/31/2020 Document Reviewed: 05/31/2020 Elsevier Patient Education  2023 Elsevier Inc.    If you have been instructed to have an in-person evaluation today at a local Urgent Care facility, please use the link below. It will take you to a list of all of our available [facility], including address, phone number and hours of operation. Please do not delay care.  [facility]  If you or a family member do not have a primary care provider, use the link below to schedule a visit and establish care. When you choose a [facility] primary  care physician or advanced practice provider, you gain a long-term partner in health. Find a Primary Care Provider  Learn more about [facility]'s in-office and virtual care options: [facility] - Get Care Now

## 2022-06-03 ENCOUNTER — Encounter: Payer: Self-pay | Admitting: Physician Assistant

## 2022-06-03 DIAGNOSIS — J111 Influenza due to unidentified influenza virus with other respiratory manifestations: Secondary | ICD-10-CM

## 2022-06-03 MED ORDER — PSEUDOEPH-BROMPHEN-DM 30-2-10 MG/5ML PO SYRP: 5.0000 mL | ORAL_SOLUTION | Freq: Four times a day (QID) | ORAL | 0 refills | Status: DC | PRN

## 2022-06-09 ENCOUNTER — Ambulatory Visit
Admission: RE | Admit: 2022-06-09 | Discharge: 2022-06-09 | Disposition: A | Discharge status: Home / Self Care | Payer: Medicaid Other | Source: Ambulatory Visit | Attending: Family Medicine | Admitting: Family Medicine

## 2022-06-09 VITALS — BP 117/83 | HR 73 | Temp 98.5°F | Resp 18 | Wt 188.0 lb

## 2022-06-09 DIAGNOSIS — R053 Chronic cough: Secondary | ICD-10-CM | POA: Diagnosis not present

## 2022-06-09 LAB — POC SARS CORONAVIRUS 2 AG -  ED: SARS Coronavirus 2 Ag: NEGATIVE

## 2022-06-09 MED ORDER — AZITHROMYCIN 250 MG PO TABS: ORAL_TABLET | ORAL | 0 refills | Status: DC

## 2022-06-09 NOTE — Discharge Instructions (Addendum)
Take plain guaifenesin (1200mg extended release tabs such as Mucinex) twice daily, with plenty of water, for cough and congestion.  May add Pseudoephedrine (30mg, one or two every 4 to 6 hours) for sinus congestion.  Get adequate rest.   May take Delsym Cough Suppressant ("12 Hour Cough Relief") at bedtime for nighttime cough.  Try warm salt water gargles for sore throat.  Stop all antihistamines for now, and other non-prescription cough/cold preparations.    

## 2022-06-09 NOTE — ED Provider Notes (Signed)
[facility]    CSN: [id number] Arrival date & time: 06/09/22  1802      History   Chief Complaint Chief Complaint  Patient presents with   Cough    I have been feeling bad for over a week now I completed my Tamiflu and I am just still not feeling well at all chest congestion and a very bad cough would like to get some relief and to feel better once and for all! - Entered by patient    HPI Monica Garrett is a 31 y.o. female.   Patient was treated for influenza 8 days ago via video visit.  She has had persistent congestion, and during the past week her non-productive cough has become worse.  She often coughs until she gags, especially at night, and sometimes feels shortness of breath.  She has had low grade fever during the past 3 days.  She denies pleuritic pain.  The history is provided by the patient.    Past Medical History:  Diagnosis Date   Allergy    Anxiety    Hx of varicella    No pertinent past medical history    Vaginal Pap smear, abnormal    Yeast infection of the vagina     There are no problems to display for this patient.   Past Surgical History:  Procedure Laterality Date   WISDOM TOOTH EXTRACTION      OB History     Gravida  2   Para  2   Term  2   Preterm      AB      Living  2      SAB      IAB      Ectopic      Multiple  0   Live Births  2            Home Medications    Prior to Admission medications   Medication Sig Start Date End Date Taking? Authorizing Provider  azithromycin (ZITHROMAX Z-PAK) 250 MG tablet Take 2 tabs today; then begin one tab once daily for 4 more days. 06/09/22  Yes Lattie Haw, MD  naproxen (NAPROSYN) 500 MG tablet Take 1 tablet (500 mg total) by mouth 2 (two) times daily as needed for mild pain, moderate pain or headache (TAKE WITH MEALS.). Patient not taking: Reported on 01/08/2021 12/25/18   Street, [place], PA-C  norgestimate-ethinyl estradiol (ORTHO-CYCLEN) 0.25-35 MG-MCG  tablet Take 1 tablet by mouth daily. 01/08/21   Adam Phenix, MD  ondansetron (ZOFRAN ODT) 4 MG disintegrating tablet Take 1 tablet (4 mg total) by mouth every 8 (eight) hours as needed for nausea or vomiting. Patient not taking: Reported on 01/08/2021 12/25/18   Street, [place], [place]  oxyCODONE-acetaminophen (PERCOCET/ROXICET) 5-325 MG tablet Take 1 tablet by mouth every 4 (four) hours as needed (for pain scale 4-7). Patient not taking: Reported on 09/29/2016 10/29/15   Julio Sicks, NP  polyethylene glycol [facility] / Ethelene Hal) packet Take 17 g by mouth daily. Take daily until you achieve soft daily bowel movements, then titrate dosing to continue having daily soft bowel movements Patient not taking: Reported on 01/08/2021 12/25/18   Street, [place], PA-C  triamcinolone ointment (KENALOG) 0.5 % Apply 1 application topically 2 (two) times daily. Patient not taking: Reported on 09/29/2016 03/16/16   Garnetta Buddy, PA    Family History Family History  Problem Relation Age of Onset   Hypertension Mother    Hypertension Father  Cancer Maternal Grandmother        ovarian   Cancer Paternal Grandfather        pancreatic   Other Neg Hx     Social History Social History   Tobacco Use   Smoking status: Never   Smokeless tobacco: Never  Vaping Use   Vaping Use: Never used  Substance Use Topics   Alcohol use: Yes    Comment: rarely   Drug use: No     Allergies   Patient has no known allergies.   Review of Systems Review of Systems + sore throat + cough No pleuritic pain No wheezing + nasal congestion No post-nasal drainage No sinus pain/pressure No itchy/red eyes No arache No hemoptysis ? SOB + fever No nausea No vomiting No abdominal pain No diarrhea No urinary symptoms No skin rash + fatigue No myalgias No headache Used OTC meds without relief   Physical Exam Triage Vital Signs ED Triage Vitals  Enc Vitals Group     BP 06/09/22 1823 117/83     Pulse  Rate 06/09/22 1823 73     Resp 06/09/22 1823 18     Temp 06/09/22 1823 98.5 F (36.9 C)     Temp Source 06/09/22 1823 Oral     SpO2 06/09/22 1823 97 %     Weight 06/09/22 1825 188 lb (85.3 kg)     Height --      Head Circumference --      Peak Flow --      Pain Score 06/09/22 1825 0     Pain Loc --      Pain Edu? --      Excl. in [place]? --    No data found.  Updated Vital Signs BP 117/83 (BP Location: Right Arm)   Pulse 73   Temp 98.5 F (36.9 C) (Oral)   Resp 18   Wt 85.3 kg   LMP 06/02/2022 (Approximate)   SpO2 97%   Breastfeeding No   BMI 27.76 kg/m   Visual Acuity Right Eye Distance:   Left Eye Distance:   Bilateral Distance:    Right Eye Near:   Left Eye Near:    Bilateral Near:     Physical Exam Nursing notes and Vital Signs reviewed. Appearance:  Patient appears stated age, and in no acute distress Eyes:  Pupils are equal, round, and reactive to light and accomodation.  Extraocular movement is intact.  Conjunctivae are not inflamed  Ears:  Canals normal.  Tympanic membranes normal.  Nose:  Mildly congested turbinates.  No sinus tenderness.   Pharynx:  Normal Neck:  Supple.  Mildly enlarged lateral nodes are present, tender to palpation on the left.   Lungs:  Clear to auscultation.  Breath sounds are equal.  Moving air well. Heart:  Regular rate and rhythm without murmurs, rubs, or gallops.  Abdomen:  Nontender without masses or hepatosplenomegaly.  Bowel sounds are present.  No CVA or flank tenderness.  Extremities:  No edema.  Skin:  No rash present.   UC Treatments / Results  Labs (all labs ordered are listed, but only abnormal results are displayed) Labs Reviewed  POC SARS CORONAVIRUS 2 AG -  ED negative    EKG   Radiology No results found.  Procedures Procedures (including critical care time)  Medications Ordered in UC Medications - No data to display  Initial Impression / Assessment and Plan / UC Course  I have reviewed the triage  vital signs and the  nursing notes.  Pertinent labs & imaging results that were available during my care of the patient were reviewed by me and considered in my medical decision making (see chart for details).    Previous video visit notes reviewed. Present onset of increased symptoms may represent a new developing viral URI.  Begin Z-pack for atypical coverage Followup with Family Doctor if not improved in one week.   Final Clinical Impressions(s) / UC Diagnoses   Final diagnoses:  Persistent cough     Discharge Instructions      Take plain guaifenesin (1200mg  extended release tabs such as Mucinex) twice daily, with plenty of water, for cough and congestion.  May add Pseudoephedrine (30mg , one or two every 4 to 6 hours) for sinus congestion.  Get adequate rest.   May take Delsym Cough Suppressant ("12 Hour Cough Relief") at bedtime for nighttime cough.  Try warm salt water gargles for sore throat.  Stop all antihistamines for now, and other non-prescription cough/cold preparations.      ED Prescriptions     Medication Sig Dispense Auth. Provider   azithromycin (ZITHROMAX Z-PAK) 250 MG tablet Take 2 tabs today; then begin one tab once daily for 4 more days. 6 tablet , MD         , MD 06/11/22 [id number]

## 2022-06-09 NOTE — ED Triage Notes (Signed)
Pt was treated for flu last week with tamiflu. Finished this rx on 8/10 but states the cough is getting worse. States she coughs so much she vomits on occasion.

## 2022-06-15 DIAGNOSIS — Z0130 Encounter for examination of blood pressure without abnormal findings: Secondary | ICD-10-CM | POA: Diagnosis not present

## 2022-06-15 DIAGNOSIS — Z79899 Other long term (current) drug therapy: Secondary | ICD-10-CM | POA: Diagnosis not present

## 2022-06-15 DIAGNOSIS — Z789 Other specified health status: Secondary | ICD-10-CM | POA: Diagnosis not present

## 2022-06-15 DIAGNOSIS — Z6827 Body mass index (BMI) 27.0-27.9, adult: Secondary | ICD-10-CM | POA: Diagnosis not present

## 2022-06-15 DIAGNOSIS — E663 Overweight: Secondary | ICD-10-CM | POA: Diagnosis not present

## 2022-06-16 DIAGNOSIS — Z79899 Other long term (current) drug therapy: Secondary | ICD-10-CM | POA: Diagnosis not present

## 2022-06-26 DIAGNOSIS — Z419 Encounter for procedure for purposes other than remedying health state, unspecified: Secondary | ICD-10-CM | POA: Diagnosis not present

## 2022-07-21 DIAGNOSIS — E663 Overweight: Secondary | ICD-10-CM | POA: Diagnosis not present

## 2022-07-21 DIAGNOSIS — Z0130 Encounter for examination of blood pressure without abnormal findings: Secondary | ICD-10-CM | POA: Diagnosis not present

## 2022-07-21 DIAGNOSIS — Z79899 Other long term (current) drug therapy: Secondary | ICD-10-CM | POA: Diagnosis not present

## 2022-07-21 DIAGNOSIS — Z789 Other specified health status: Secondary | ICD-10-CM | POA: Diagnosis not present

## 2022-07-21 DIAGNOSIS — R892 Abnormal level of other drugs, medicaments and biological substances in specimens from other organs, systems and tissues: Secondary | ICD-10-CM | POA: Diagnosis not present

## 2022-07-21 DIAGNOSIS — Z6827 Body mass index (BMI) 27.0-27.9, adult: Secondary | ICD-10-CM | POA: Diagnosis not present

## 2022-07-22 DIAGNOSIS — Z79899 Other long term (current) drug therapy: Secondary | ICD-10-CM | POA: Diagnosis not present

## 2022-07-26 DIAGNOSIS — Z419 Encounter for procedure for purposes other than remedying health state, unspecified: Secondary | ICD-10-CM | POA: Diagnosis not present

## 2022-08-26 DIAGNOSIS — Z419 Encounter for procedure for purposes other than remedying health state, unspecified: Secondary | ICD-10-CM | POA: Diagnosis not present

## 2022-09-25 DIAGNOSIS — Z419 Encounter for procedure for purposes other than remedying health state, unspecified: Secondary | ICD-10-CM | POA: Diagnosis not present

## 2022-10-26 DIAGNOSIS — Z419 Encounter for procedure for purposes other than remedying health state, unspecified: Secondary | ICD-10-CM | POA: Diagnosis not present

## 2022-11-23 ENCOUNTER — Ambulatory Visit
Admission: RE | Admit: 2022-11-23 | Discharge: 2022-11-23 | Disposition: A | Discharge status: Home / Self Care | Payer: Medicaid Other | Source: Ambulatory Visit | Attending: Family Medicine | Admitting: Family Medicine

## 2022-11-23 VITALS — BP 131/84 | HR 69 | Temp 98.7°F | Resp 18 | Ht 69.0 in | Wt 190.0 lb

## 2022-11-23 DIAGNOSIS — Z20818 Contact with and (suspected) exposure to other bacterial communicable diseases: Secondary | ICD-10-CM | POA: Diagnosis not present

## 2022-11-23 DIAGNOSIS — J029 Acute pharyngitis, unspecified: Secondary | ICD-10-CM

## 2022-11-23 LAB — POCT RAPID STREP A (OFFICE): Rapid Strep A Screen: NEGATIVE

## 2022-11-23 MED ORDER — AMOXICILLIN 875 MG PO TABS: 875.0000 mg | ORAL_TABLET | Freq: Two times a day (BID) | ORAL | 0 refills | Status: AC

## 2022-11-23 MED ORDER — FLUCONAZOLE 150 MG PO TABS: 150.0000 mg | ORAL_TABLET | Freq: Every day | ORAL | 0 refills | Status: AC

## 2022-11-23 NOTE — ED Triage Notes (Signed)
Patient c/o headache and sore throat x 2 days.  Son tested positive for strep today.  Patient has taken Tylenol and Ibuprofen for pain.

## 2022-11-23 NOTE — ED Provider Notes (Signed)
[facility]    CSN: [id number] Arrival date & time: 11/23/22  1732      History   Chief Complaint Chief Complaint  Patient presents with   Headache    Headaches frequently want to get strep test - Entered by patient    HPI Monica Garrett is a 32 y.o. female.   HPI  Her young son was diagnosed with strep today.  She has had a sore throat for a couple of days.  She also has a headache.  Tonsils are large and red.  She is concerned for strep  Past Medical History:  Diagnosis Date   Allergy    Anxiety    Hx of varicella    No pertinent past medical history    Vaginal Pap smear, abnormal    Yeast infection of the vagina     There are no problems to display for this patient.   Past Surgical History:  Procedure Laterality Date   WISDOM TOOTH EXTRACTION      OB History     Gravida  2   Para  2   Term  2   Preterm      AB      Living  2      SAB      IAB      Ectopic      Multiple  0   Live Births  2            Home Medications    Prior to Admission medications   Medication Sig Start Date End Date Taking? Authorizing Provider  amoxicillin (AMOXIL) 875 MG tablet Take 1 tablet (875 mg total) by mouth 2 (two) times daily for 10 days. 11/23/22 12/03/22 Yes Raylene Everts, MD  fluconazole (DIFLUCAN) 150 MG tablet Take 1 tablet (150 mg total) by mouth daily. Repeat in 1 week if needed 11/23/22  Yes Raylene Everts, MD    Family History Family History  Problem Relation Age of Onset   Hypertension Mother    Hypertension Father    Cancer Maternal Grandmother        ovarian   Cancer Paternal Grandfather        pancreatic   Other Neg Hx     Social History Social History   Tobacco Use   Smoking status: Never   Smokeless tobacco: Never  Vaping Use   Vaping Use: Never used  Substance Use Topics   Alcohol use: Yes    Comment: rarely   Drug use: No     Allergies   Patient has no known allergies.   Review of  Systems Review of Systems  See HPI Physical Exam Triage Vital Signs ED Triage Vitals  Enc Vitals Group     BP 11/23/22 1813 131/84     Pulse Rate 11/23/22 1813 69     Resp 11/23/22 1813 18     Temp 11/23/22 1813 98.7 F (37.1 C)     Temp Source 11/23/22 1813 Oral     SpO2 11/23/22 1813 100 %     Weight 11/23/22 1814 190 lb (86.2 kg)     Height 11/23/22 1814 5\' 9"  (1.753 m)     Head Circumference --      Peak Flow --      Pain Score 11/23/22 1814 4     Pain Loc --      Pain Edu? --      Excl. in [place]? --  No data found.  Updated Vital Signs BP 131/84 (BP Location: Left Arm)   Pulse 69   Temp 98.7 F (37.1 C) (Oral)   Resp 18   Ht 5\' 9"  (1.753 m)   Wt 86.2 kg   LMP 10/30/2022 (Exact Date)   SpO2 100%   BMI 28.06 kg/m       Physical Exam Constitutional:      General: She is not in acute distress.    Appearance: She is well-developed.  HENT:     Head: Normocephalic and atraumatic.     Right Ear: Tympanic membrane and ear canal normal.     Left Ear: Tympanic membrane and ear canal normal.     Nose: No congestion or rhinorrhea.     Mouth/Throat:     Pharynx: Posterior oropharyngeal erythema present.     Comments: Tonsils 3+ enlarged.  Erythema.  No exudate Eyes:     Conjunctiva/sclera: Conjunctivae normal.     Pupils: Pupils are equal, round, and reactive to light.  Cardiovascular:     Rate and Rhythm: Normal rate.  Pulmonary:     Effort: Pulmonary effort is normal. No respiratory distress.  Abdominal:     General: There is no distension.     Palpations: Abdomen is soft.  Musculoskeletal:        General: Normal range of motion.     Cervical back: Normal range of motion.  Lymphadenopathy:     Cervical: Cervical adenopathy present.  Skin:    General: Skin is warm and dry.  Neurological:     General: No focal deficit present.     Mental Status: She is alert.     Gait: Gait normal.      UC Treatments / Results  Labs (all labs ordered are listed,  but only abnormal results are displayed) Labs Reviewed  CULTURE, GROUP A STREP [facility])  POCT RAPID STREP A (OFFICE)    EKG   Radiology No results found.  Procedures Procedures (including critical care time)  Medications Ordered in UC Medications - No data to display  Initial Impression / Assessment and Plan / UC Course  I have reviewed the triage vital signs and the nursing notes.  Pertinent labs & imaging results that were available during my care of the patient were reviewed by me and considered in my medical decision making (see chart for details).     Final Clinical Impressions(s) / UC Diagnoses   Final diagnoses:  Exposure to strep throat  Acute pharyngitis, unspecified etiology     Discharge Instructions      Take Tylenol or ibuprofen for headache Take amoxicillin 2 times a day. Check MyChart for strep test in 2 to 3 days.  If your strep test is negative, you may stop the amoxicillin as soon as you feel well If your strep test is positive you need to take 10 full days of antibiotic Filled Diflucan if needed     ED Prescriptions     Medication Sig Dispense Auth. Provider   amoxicillin (AMOXIL) 875 MG tablet Take 1 tablet (875 mg total) by mouth 2 (two) times daily for 10 days. 20 tablet Raylene Everts, MD   fluconazole (DIFLUCAN) 150 MG tablet Take 1 tablet (150 mg total) by mouth daily. Repeat in 1 week if needed 2 tablet Raylene Everts, MD      PDMP not reviewed this encounter.   Raylene Everts, MD 11/23/22 [id number]

## 2022-11-23 NOTE — Discharge Instructions (Signed)
Take Tylenol or ibuprofen for headache Take amoxicillin 2 times a day. Check MyChart for strep test in 2 to 3 days.  If your strep test is negative, you may stop the amoxicillin as soon as you feel well If your strep test is positive you need to take 10 full days of antibiotic Filled Diflucan if needed

## 2022-11-26 DIAGNOSIS — Z419 Encounter for procedure for purposes other than remedying health state, unspecified: Secondary | ICD-10-CM | POA: Diagnosis not present

## 2022-11-26 LAB — CULTURE, GROUP A STREP (THRC)

## 2022-12-25 DIAGNOSIS — Z419 Encounter for procedure for purposes other than remedying health state, unspecified: Secondary | ICD-10-CM | POA: Diagnosis not present

## 2023-02-08 ENCOUNTER — Ambulatory Visit: Payer: Self-pay | Admitting: Nurse Practitioner

## 2023-03-26 ENCOUNTER — Encounter: Payer: Self-pay | Admitting: Nurse Practitioner

## 2023-09-10 ENCOUNTER — Encounter (HOSPITAL_BASED_OUTPATIENT_CLINIC_OR_DEPARTMENT_OTHER): Payer: Self-pay

## 2023-09-10 ENCOUNTER — Emergency Department (HOSPITAL_BASED_OUTPATIENT_CLINIC_OR_DEPARTMENT_OTHER): Payer: BLUE CROSS/BLUE SHIELD

## 2023-09-10 ENCOUNTER — Emergency Department (HOSPITAL_BASED_OUTPATIENT_CLINIC_OR_DEPARTMENT_OTHER)
Admission: EM | Admit: 2023-09-10 | Discharge: 2023-09-10 | Disposition: A | Discharge status: Home / Self Care | Payer: BLUE CROSS/BLUE SHIELD | Attending: Emergency Medicine | Admitting: Emergency Medicine

## 2023-09-10 ENCOUNTER — Other Ambulatory Visit: Payer: Self-pay

## 2023-09-10 DIAGNOSIS — R1011 Right upper quadrant pain: Secondary | ICD-10-CM | POA: Diagnosis present

## 2023-09-10 LAB — CBC WITH DIFFERENTIAL/PLATELET
Abs Immature Granulocytes: 0.01 10*3/uL (ref 0.00–0.07)
Basophils Absolute: 0.1 10*3/uL (ref 0.0–0.1)
Basophils Relative: 1 %
Eosinophils Absolute: 0.3 10*3/uL (ref 0.0–0.5)
Eosinophils Relative: 4 %
HCT: 38.7 % (ref 36.0–46.0)
Hemoglobin: 13 g/dL (ref 12.0–15.0)
Immature Granulocytes: 0 %
Lymphocytes Relative: 22 %
Lymphs Abs: 1.5 10*3/uL (ref 0.7–4.0)
MCH: 28.9 pg (ref 26.0–34.0)
MCHC: 33.6 g/dL (ref 30.0–36.0)
MCV: 86 fL (ref 80.0–100.0)
Monocytes Absolute: 0.6 10*3/uL (ref 0.1–1.0)
Monocytes Relative: 9 %
Neutro Abs: 4.5 10*3/uL (ref 1.7–7.7)
Neutrophils Relative %: 64 %
Platelets: 314 10*3/uL (ref 150–400)
RBC: 4.5 MIL/uL (ref 3.87–5.11)
RDW: 12.3 % (ref 11.5–15.5)
WBC: 6.9 10*3/uL (ref 4.0–10.5)
nRBC: 0 % (ref 0.0–0.2)

## 2023-09-10 LAB — COMPREHENSIVE METABOLIC PANEL
ALT: 42 U/L (ref 0–44)
AST: 26 U/L (ref 15–41)
Albumin: 3.7 g/dL (ref 3.5–5.0)
Alkaline Phosphatase: 115 U/L (ref 38–126)
Anion gap: 6 (ref 5–15)
BUN: 8 mg/dL (ref 6–20)
CO2: 25 mmol/L (ref 22–32)
Calcium: 8.5 mg/dL — ABNORMAL LOW (ref 8.9–10.3)
Chloride: 107 mmol/L (ref 98–111)
Creatinine, Ser: 0.77 mg/dL (ref 0.44–1.00)
GFR, Estimated: >60 mL/min (ref >60)
Glucose, Bld: 76 mg/dL (ref 70–99)
Potassium: 3.4 mmol/L — ABNORMAL LOW (ref 3.5–5.1)
Sodium: 138 mmol/L (ref 135–145)
Total Bilirubin: 0.6 mg/dL (ref <1.2)
Total Protein: 7.3 g/dL (ref 6.5–8.1)

## 2023-09-10 LAB — URINALYSIS, MICROSCOPIC (REFLEX)

## 2023-09-10 LAB — URINALYSIS, ROUTINE W REFLEX MICROSCOPIC
Bilirubin Urine: NEGATIVE
Glucose, UA: NEGATIVE mg/dL
Ketones, ur: NEGATIVE mg/dL
Leukocytes,Ua: NEGATIVE
Nitrite: NEGATIVE
Protein, ur: NEGATIVE mg/dL
Specific Gravity, Urine: >=1.03 (ref 1.005–1.030)
pH: 5.5 (ref 5.0–8.0)

## 2023-09-10 LAB — HCG, SERUM, QUALITATIVE: Preg, Serum: NEGATIVE

## 2023-09-10 LAB — LIPASE, BLOOD: Lipase: 42 U/L (ref 11–51)

## 2023-09-10 MED ORDER — ONDANSETRON 4 MG PO TBDP
4.0000 mg | ORAL_TABLET | Freq: Once | ORAL | Status: AC
  Administered 2023-09-10: 4 mg via ORAL
  Filled 2023-09-10: qty 1

## 2023-09-10 MED ORDER — ONDANSETRON HCL 4 MG PO TABS: 4.0000 mg | ORAL_TABLET | Freq: Four times a day (QID) | ORAL | 0 refills | Status: AC

## 2023-09-10 MED ORDER — OXYCODONE HCL 5 MG PO TABS: 5.0000 mg | ORAL_TABLET | Freq: Four times a day (QID) | ORAL | 0 refills | Status: AC | PRN

## 2023-09-10 MED ORDER — IBUPROFEN 400 MG PO TABS
600.0000 mg | ORAL_TABLET | Freq: Once | ORAL | Status: AC
  Administered 2023-09-10: 600 mg via ORAL
  Filled 2023-09-10: qty 1

## 2023-09-10 MED ORDER — OXYCODONE HCL 5 MG PO TABS: 5.0000 mg | ORAL_TABLET | Freq: Four times a day (QID) | ORAL | 0 refills | Status: DC | PRN

## 2023-09-10 NOTE — ED Provider Notes (Signed)
[facility] Provider Note   CSN: [id number] Arrival date & time: 09/10/23  1637     History  Chief Complaint  Patient presents with   Abdominal Pain    Monica Garrett is a 32 y.o. female.  Patient is a 32 yo female presenting for RUQ pain that is intermittent, approx 30 min after eating, and associated nausea and vomiting. Denies fevers. No diarrhea.   The history is provided by the patient. No language interpreter was used.  Abdominal Pain Associated symptoms: nausea   Associated symptoms: no chest pain, no chills, no cough, no dysuria, no fever, no hematuria, no shortness of breath, no sore throat and no vomiting        Home Medications Prior to Admission medications   Medication Sig Start Date End Date Taking? Authorizing Provider  ondansetron (ZOFRAN) 4 MG tablet Take 1 tablet (4 mg total) by mouth every 6 (six) hours. 09/10/23  Yes Edwin Dada P, DO  fluconazole (DIFLUCAN) 150 MG tablet Take 1 tablet (150 mg total) by mouth daily. Repeat in 1 week if needed 11/23/22   Eustace Moore, MD  oxyCODONE (ROXICODONE) 5 MG immediate release tablet Take 1 tablet (5 mg total) by mouth every 6 (six) hours as needed for up to 3 days for severe pain (pain score 7-10). 09/10/23 09/13/23  Franne Forts, DO      Allergies    Patient has no known allergies.    Review of Systems   Review of Systems  Constitutional:  Negative for chills and fever.  HENT:  Negative for ear pain and sore throat.   Eyes:  Negative for pain and visual disturbance.  Respiratory:  Negative for cough and shortness of breath.   Cardiovascular:  Negative for chest pain and palpitations.  Gastrointestinal:  Positive for abdominal pain and nausea. Negative for vomiting.  Genitourinary:  Negative for dysuria and hematuria.  Musculoskeletal:  Negative for arthralgias and back pain.  Skin:  Negative for color change and rash.  Neurological:  Negative for  seizures and syncope.  All other systems reviewed and are negative.   Physical Exam Updated Vital Signs BP 124/89 (BP Location: Right Arm)   Pulse 72   Temp 98 F (36.7 C) (Oral)   Resp 16   Ht 5\' 9"  (1.753 m)   Wt 87 kg   LMP 09/09/2023   SpO2 100%   BMI 28.32 kg/m  Physical Exam Vitals and nursing note reviewed.  Constitutional:      General: She is not in acute distress.    Appearance: She is well-developed.  HENT:     Head: Normocephalic and atraumatic.  Eyes:     Conjunctiva/sclera: Conjunctivae normal.  Cardiovascular:     Rate and Rhythm: Normal rate and regular rhythm.     Heart sounds: No murmur heard. Pulmonary:     Effort: Pulmonary effort is normal. No respiratory distress.     Breath sounds: Normal breath sounds.  Abdominal:     Palpations: Abdomen is soft.     Tenderness: There is abdominal tenderness in the right upper quadrant. There is no guarding or rebound.  Musculoskeletal:        General: No swelling.     Cervical back: Neck supple.  Skin:    General: Skin is warm and dry.     Capillary Refill: Capillary refill takes less than 2 seconds.  Neurological:     Mental Status: She is alert.  Psychiatric:        Mood and Affect: Mood normal.     ED Results / Procedures / Treatments   Labs (all labs ordered are listed, but only abnormal results are displayed) Labs Reviewed  URINALYSIS, ROUTINE W REFLEX MICROSCOPIC - Abnormal; Notable for the following components:      Result Value   Hgb urine dipstick MODERATE (*)    All other components within normal limits  COMPREHENSIVE METABOLIC PANEL - Abnormal; Notable for the following components:   Potassium 3.4 (*)    Calcium 8.5 (*)    All other components within normal limits  URINALYSIS, MICROSCOPIC (REFLEX) - Abnormal; Notable for the following components:   Bacteria, UA RARE (*)    All other components within normal limits  CBC WITH DIFFERENTIAL/PLATELET  LIPASE, BLOOD  HCG, SERUM,  QUALITATIVE    EKG None  Radiology US Abdomen Limited RUQ (LIVER/GB)  Result Date: 09/10/2023 CLINICAL DATA:  151471 RUQ pain 151471 EXAM: ULTRASOUND ABDOMEN LIMITED COMPARISON:  None Available. FINDINGS: The liver demonstrates normal parenchymal echogenicity and homogeneous texture without focal hepatic parenchymal lesions or intrahepatic ductal dilatation. Hepatopetal portal vein flow. The gallbladder demonstrates no stones, wall thickening or pericholecystic fluid. CBD measured 0.2cm. Imaged portions of the pancreas were unremarkable. IMPRESSION: Unremarkable examination of the right upper quadrant. Electronically Signed   By: Layla Maw M.D.   On: 09/10/2023 21:24    Procedures Procedures    Medications Ordered in ED Medications  ondansetron (ZOFRAN-ODT) disintegrating tablet 4 mg (has no administration in time range)  ibuprofen (ADVIL) tablet 600 mg (has no administration in time range)    ED Course/ Medical Decision Making/ A&P                                 Medical Decision Making Amount and/or Complexity of Data Reviewed Labs: ordered. Radiology: ordered.  Risk Prescription drug management.   10:03 PM 32 yo female presenting for RUQ pain that is intermittent, approx 30 min after eating, and associated nausea and vomiting.  Patient is alert oriented x 3, no acute distress, afebrile, stable vital signs.  Physical exam demonstrates no rashes or skin involvement.  Patient has tenderness palpation of the right upper quadrant only.  No flank pain.  No rib tenderness.  No muscle tenderness.  Stable liver profile, lipase, and renal function.  Right upper quadrant ultrasound demonstrates no cholecystitis.  No cholelithiasis.  No gallbladder sludge.  Normal common bile duct.  Normal liver echogenicity and texture doubt focal lesions.  Symptoms improved after Zofran.  UA stable with no UTI.  No Pyelo.  Stable renal function.  No history of fever or cough so less likely to be  left lower lobe pneumonia.  No hematuria.  Safe for discharge home at this time with close follow-up with GI specialist symptoms do not improve.  Patient in no distress and overall condition improved here in the ED. Detailed discussions were had with the patient regarding current findings, and need for close f/u with PCP or on call doctor. The patient has been instructed to return immediately if the symptoms worsen in any way for re-evaluation. Patient verbalized understanding and is in agreement with current care plan. All questions answered prior to discharge.         Final Clinical Impression(s) / ED Diagnoses Final diagnoses:  RUQ pain    Rx / DC Orders ED Discharge Orders  Ordered    ondansetron (ZOFRAN) 4 MG tablet  Every 6 hours        09/10/23 2202    oxyCODONE (ROXICODONE) 5 MG immediate release tablet  Every 6 hours PRN,   Status:  Discontinued        09/10/23 2202    oxyCODONE (ROXICODONE) 5 MG immediate release tablet  Every 6 hours PRN        09/10/23 2203              Franne Forts, DO 09/10/23 2203

## 2023-09-10 NOTE — ED Triage Notes (Signed)
The Patient having abd pain for a while but has gotten worse.

## 2024-10-11 ENCOUNTER — Other Ambulatory Visit (HOSPITAL_BASED_OUTPATIENT_CLINIC_OR_DEPARTMENT_OTHER): Payer: Self-pay

## 2024-10-20 ENCOUNTER — Ambulatory Visit: Admitting: Physician Assistant
